# Patient Record
Sex: Female | Born: 1970 | Race: White | Hispanic: No | Marital: Single | Smoking: Current every day smoker
Health system: Southern US, Community
[De-identification: ages and names within clinical notes are randomized; demographics above are authoritative.]

## PROBLEM LIST (undated history)

## (undated) DIAGNOSIS — I639 Cerebral infarction, unspecified: Secondary | ICD-10-CM

## (undated) DIAGNOSIS — F329 Major depressive disorder, single episode, unspecified: Secondary | ICD-10-CM

## (undated) DIAGNOSIS — I671 Cerebral aneurysm, nonruptured: Secondary | ICD-10-CM

## (undated) DIAGNOSIS — G839 Paralytic syndrome, unspecified: Secondary | ICD-10-CM

## (undated) DIAGNOSIS — F32A Depression, unspecified: Secondary | ICD-10-CM

## (undated) HISTORY — PX: CEREBRAL ANEURYSM REPAIR: SHX164

---
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
## 2013-11-30 DIAGNOSIS — I6930 Unspecified sequelae of cerebral infarction: Secondary | ICD-10-CM | POA: Insufficient documentation

## 2013-12-01 DIAGNOSIS — I671 Cerebral aneurysm, nonruptured: Secondary | ICD-10-CM | POA: Insufficient documentation

## 2013-12-04 DIAGNOSIS — I69891 Dysphagia following other cerebrovascular disease: Secondary | ICD-10-CM | POA: Insufficient documentation

## 2014-01-28 DIAGNOSIS — K219 Gastro-esophageal reflux disease without esophagitis: Secondary | ICD-10-CM | POA: Insufficient documentation

## 2014-04-09 DIAGNOSIS — F17200 Nicotine dependence, unspecified, uncomplicated: Secondary | ICD-10-CM | POA: Insufficient documentation

## 2014-04-09 DIAGNOSIS — F1410 Cocaine abuse, uncomplicated: Secondary | ICD-10-CM | POA: Insufficient documentation

## 2019-05-03 ENCOUNTER — Encounter (HOSPITAL_COMMUNITY): Payer: Self-pay | Admitting: *Deleted

## 2019-05-03 ENCOUNTER — Other Ambulatory Visit: Payer: Self-pay

## 2019-05-03 ENCOUNTER — Emergency Department (HOSPITAL_COMMUNITY)
Admission: EM | Admit: 2019-05-03 | Discharge: 2019-05-03 | Disposition: A | Discharge status: Home / Self Care | Payer: Medicare Other | Attending: Emergency Medicine | Admitting: Emergency Medicine

## 2019-05-03 ENCOUNTER — Emergency Department (HOSPITAL_COMMUNITY): Payer: Medicare Other

## 2019-05-03 DIAGNOSIS — F17200 Nicotine dependence, unspecified, uncomplicated: Secondary | ICD-10-CM | POA: Diagnosis not present

## 2019-05-03 DIAGNOSIS — R51 Headache: Secondary | ICD-10-CM | POA: Insufficient documentation

## 2019-05-03 DIAGNOSIS — R519 Headache, unspecified: Secondary | ICD-10-CM

## 2019-05-03 DIAGNOSIS — L03031 Cellulitis of right toe: Secondary | ICD-10-CM

## 2019-05-03 HISTORY — DX: Cerebral aneurysm, nonruptured: I67.1

## 2019-05-03 HISTORY — DX: Cerebral infarction, unspecified: I63.9

## 2019-05-03 HISTORY — DX: Paralytic syndrome, unspecified: G83.9

## 2019-05-03 HISTORY — DX: Depression, unspecified: F32.A

## 2019-05-03 LAB — CBC WITH DIFFERENTIAL/PLATELET
Abs Immature Granulocytes: 0.06 10*3/uL (ref 0.00–0.07)
Basophils Absolute: 0 10*3/uL (ref 0.0–0.1)
Basophils Relative: 0 %
Eosinophils Absolute: 0.1 10*3/uL (ref 0.0–0.5)
Eosinophils Relative: 1 %
HCT: 49.8 % — ABNORMAL HIGH (ref 36.0–46.0)
Hemoglobin: 16.1 g/dL — ABNORMAL HIGH (ref 12.0–15.0)
Immature Granulocytes: 1 %
Lymphocytes Relative: 36 %
Lymphs Abs: 3.7 10*3/uL (ref 0.7–4.0)
MCH: 29.7 pg (ref 26.0–34.0)
MCHC: 32.3 g/dL (ref 30.0–36.0)
MCV: 91.7 fL (ref 80.0–100.0)
Monocytes Absolute: 0.7 10*3/uL (ref 0.1–1.0)
Monocytes Relative: 7 %
Neutro Abs: 5.6 10*3/uL (ref 1.7–7.7)
Neutrophils Relative %: 55 %
Platelets: 354 10*3/uL (ref 150–400)
RBC: 5.43 MIL/uL — ABNORMAL HIGH (ref 3.87–5.11)
RDW: 13 % (ref 11.5–15.5)
WBC: 10.1 10*3/uL (ref 4.0–10.5)
nRBC: 0 % (ref 0.0–0.2)

## 2019-05-03 LAB — I-STAT CHEM 8, ED
BUN: 14 mg/dL (ref 6–20)
Calcium, Ion: 1.21 mmol/L (ref 1.15–1.40)
Chloride: 104 mmol/L (ref 98–111)
Creatinine, Ser: 1 mg/dL (ref 0.44–1.00)
Glucose, Bld: 97 mg/dL (ref 70–99)
HCT: 50 % — ABNORMAL HIGH (ref 36.0–46.0)
Hemoglobin: 17 g/dL — ABNORMAL HIGH (ref 12.0–15.0)
Potassium: 3.8 mmol/L (ref 3.5–5.1)
Sodium: 143 mmol/L (ref 135–145)
TCO2: 26 mmol/L (ref 22–32)

## 2019-05-03 LAB — I-STAT CREATININE, ED: Creatinine, Ser: 1 mg/dL (ref 0.44–1.00)

## 2019-05-03 MED ORDER — PROCHLORPERAZINE EDISYLATE 10 MG/2ML IJ SOLN
10.0000 mg | Freq: Once | INTRAMUSCULAR | Status: AC
  Administered 2019-05-03: 11:00:00 10 mg via INTRAVENOUS
  Filled 2019-05-03: qty 2

## 2019-05-03 MED ORDER — SODIUM CHLORIDE 0.9 % IV BOLUS
1000.0000 mL | Freq: Once | INTRAVENOUS | Status: AC
  Administered 2019-05-03: 1000 mL via INTRAVENOUS

## 2019-05-03 MED ORDER — IOHEXOL 350 MG/ML SOLN
75.0000 mL | Freq: Once | INTRAVENOUS | Status: AC | PRN
  Administered 2019-05-03: 75 mL via INTRAVENOUS

## 2019-05-03 MED ORDER — ACETAMINOPHEN 500 MG PO TABS
1000.0000 mg | ORAL_TABLET | Freq: Once | ORAL | Status: AC
  Administered 2019-05-03: 1000 mg via ORAL
  Filled 2019-05-03: qty 2

## 2019-05-03 MED ORDER — DIPHENHYDRAMINE HCL 50 MG/ML IJ SOLN
25.0000 mg | Freq: Once | INTRAMUSCULAR | Status: AC
  Administered 2019-05-03: 11:00:00 25 mg via INTRAVENOUS
  Filled 2019-05-03: qty 1

## 2019-05-03 MED ORDER — CEPHALEXIN 500 MG PO CAPS: 500.0000 mg | ORAL_CAPSULE | Freq: Two times a day (BID) | ORAL | 0 refills | Status: AC

## 2019-05-03 NOTE — ED Triage Notes (Signed)
Pt c/o headache for the past 2 days. 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 Pt most concerned with pain in her toes in the R foot.

## 2019-05-03 NOTE — ED Provider Notes (Signed)
[facility] EMERGENCY DEPARTMENT Provider Note   CSN: [id number] Arrival date & time: 05/03/19  0023    History   Chief Complaint Chief Complaint  Patient presents with   Headache    HPI Abigail Reese is a 48 y.o. female.     HPI  Thursday evening began to have headache, boyfriend brought 2 tylenol and slept all day Friday, then Friday evening decided to come in. Has been waiting in waiting room.  Headache started slowly at [place] and then worsened.  By last night was 10/10, severe pain.   Hurts left temple. Improved with tylenol then worsened again now.  No nausea or vomiting. Not worse with bright lights or loud sounds.  No new numbness/weakness other than 2 toes of right foot (normally has weakness and numbness, now having some pain/tingling to 4th and 5th toes of right foot.   No fevers, chills.     Past Medical History:  Diagnosis Date   Brain aneurysm    Depression    Paralysis ([place])    R sided deficits    Stroke ([place])     There are no active problems to display for this patient.   Past Surgical History:  Procedure Laterality Date   CEREBRAL ANEURYSM REPAIR     CESAREAN SECTION       OB History   No obstetric history on file.      Home Medications    Prior to Admission medications   Not on File    Family History No family history on file.  Social History Social History   Tobacco Use   Smoking status: Current Every Day Smoker  Substance Use Topics   Alcohol use: Yes    Comment: rarely   Drug use: Not on file     Allergies   Patient has no known allergies.   Review of Systems Review of Systems  Constitutional: Negative for fever.  HENT: Negative for sore throat.   Eyes: Negative for visual disturbance.  Respiratory: Negative for cough and shortness of breath.   Cardiovascular: Negative for chest pain.  Gastrointestinal: Negative for abdominal pain, nausea and vomiting.  Genitourinary: Negative for  difficulty urinating.  Musculoskeletal: Positive for arthralgias. Negative for back pain and neck pain.  Skin: Negative for rash.  Neurological: Positive for headaches. Negative for syncope, facial asymmetry, weakness and numbness.     Physical Exam Updated Vital Signs BP 110/79 (BP Location: Left Arm)    Pulse 77    Temp 98.1 F (36.7 C) (Oral)    Resp 16    SpO2 100%   Physical Exam Vitals signs and nursing note reviewed.  Constitutional:      General: She is not in acute distress.    Appearance: She is well-developed. She is not diaphoretic.  HENT:     Head: Normocephalic and atraumatic.  Eyes:     Conjunctiva/sclera: Conjunctivae normal.  Neck:     Musculoskeletal: Normal range of motion.  Cardiovascular:     Rate and Rhythm: Normal rate and regular rhythm.  Pulmonary:     Effort: Pulmonary effort is normal. No respiratory distress.  Musculoskeletal:     Right foot: Tenderness (tenderness and erythema to right small toe) present.  Skin:    General: Skin is warm and dry.     Findings: No erythema or rash.  Neurological:     Mental Status: She is alert and oriented to person, place, and time.      ED Treatments /  Results  Labs (all labs ordered are listed, but only abnormal results are displayed) Labs Reviewed  CBC WITH DIFFERENTIAL/PLATELET - Abnormal; Notable for the following components:      Result Value   RBC 5.43 (*)    Hemoglobin 16.1 (*)    HCT 49.8 (*)    All other components within normal limits  I-STAT CHEM 8, ED - Abnormal; Notable for the following components:   Hemoglobin 17.0 (*)    HCT 50.0 (*)    All other components within normal limits  I-STAT CREATININE, ED    EKG None  Radiology Ct Angio Head W Or Wo Contrast  Result Date: 05/03/2019 CLINICAL DATA:  Acute left-sided headache. History of stented aneurysm. EXAM: CT ANGIOGRAPHY HEAD TECHNIQUE: Multidetector CT imaging of the head was performed using the standard protocol during bolus  administration of intravenous contrast. Multiplanar CT image reconstructions and MIPs were obtained to evaluate the vascular anatomy. CONTRAST:  75mL OMNIPAQUE IOHEXOL 350 MG/ML SOLN COMPARISON:  None. FINDINGS: CT HEAD Brain: There is a large amount of encephalomalacia within the posterior left hemisphere, within the posterior portion of the left MCA territory. There is no acute intracranial hemorrhage. There is ex vacuo dilatation of the lateral ventricles. Vascular: Coil mass near the left ICA terminus. Skull: The visualized skull base, calvarium and extracranial soft tissues are normal. Sinuses/Orbits: No fluid levels or advanced mucosal thickening of the visualized paranasal sinuses. No mastoid or middle ear effusion. The orbits are normal. CTA HEAD POSTERIOR CIRCULATION: --Vertebral arteries: Normal V4 segments. --Posterior inferior cerebellar arteries (PICA): Patent origins from the vertebral arteries. --Anterior inferior cerebellar arteries (AICA): Patent origins from the basilar artery. --Basilar artery: Normal. --Superior cerebellar arteries: Normal. --Posterior cerebral arteries (PCA): Normal. Both originate from the basilar artery. Posterior communicating arteries (p-comm) are diminutive or absent. ANTERIOR CIRCULATION: --Intracranial internal carotid arteries: Coil mass near the left ICA terminus. No residual aneurysm filling is visible, though the surrounding area is largely obscured by streak artifact. --Anterior cerebral arteries (ACA): Normal. Both A1 segments are present. Patent anterior communicating artery (a-comm). --Middle cerebral arteries (MCA): Normal. IMPRESSION: 1. No acute intracranial abnormality. 2. 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 Electronically Signed   By: Deatra RobinsonKevin  Herman M.D.   On: 05/03/2019 02:50    Procedures Procedures (including critical care time)  Medications Ordered in ED Medications  iohexol  (OMNIPAQUE) 350 MG/ML injection 75 mL (75 mLs Intravenous Contrast Given 05/03/19 0148)  prochlorperazine (COMPAZINE) injection 10 mg (10 mg Intravenous Given 05/03/19 1040)  diphenhydrAMINE (BENADRYL) injection 25 mg (25 mg Intravenous Given 05/03/19 1037)  sodium chloride 0.9 % bolus 1,000 mL (1,000 mLs Intravenous Bolus from Bag 05/03/19 1045)  acetaminophen (TYLENOL) tablet 1,000 mg (1,000 mg Oral Given 05/03/19 1036)     Initial Impression / Assessment and Plan / ED Course  I have reviewed the triage vital signs and the nursing notes.  Pertinent labs & imaging results that were available during my care of the patient were reviewed by me and considered in my medical decision making (see chart for details).        48 year old female with history of aneurysm, CVA who presents with headaches and toe pain.  CT angios head and neck performed showing no acute intracranial abnormality, post coiling of left ICA aneurysm with posterior left MCA territory encephalomalacia.  Given history provided a negative CTA, have low suspicion for subarachnoid hemorrhage.  Given headache cocktail  for headache.  Labs otherwise do not show any significant abnormalities.  Headache with improvement in ED.  She does report pain to her fourth and fifth right toes.  She has palpable pulses bilaterally, location for acute arterial occlusion.  She has erythema just of the small toe.  I do not feel this is in a pattern of embolic phenomena or COVID toes.  No history of trauma and have low suspicion for fracture.  Suspect a localized cellulitis and will prescribe Keflex.  Final Clinical Impressions(s) / ED Diagnoses   Final diagnoses:  Cellulitis of toe of right foot  Acute nonintractable headache, unspecified headache type    ED Discharge Orders    None       Alvira MondaySchlossman, Roseland Braun, MD 05/04/19 [phone]

## 2019-12-30 IMAGING — CT CT ANGIOGRAPHY HEAD
2 of 11 series · 8 of 47 positions shown · IV contrast (Omni 300)
Comparison: None.

CLINICAL DATA: Acute left-sided headache. History of stented
aneurysm.

EXAM:
CT ANGIOGRAPHY HEAD
TECHNIQUE: Multidetector CT imaging of the head was performed using the
standard protocol during bolus administration of intravenous
contrast. Multiplanar CT image reconstructions and MIPs were
obtained to evaluate the vascular anatomy.
CONTRAST:  75mL OMNIPAQUE IOHEXOL 350 MG/ML SOLN

[Series 6: head bone · axial · 0.45mm/px · z∈[-95,+13]mm · 4 of 90 slices shown]
[im 18/90  bone]
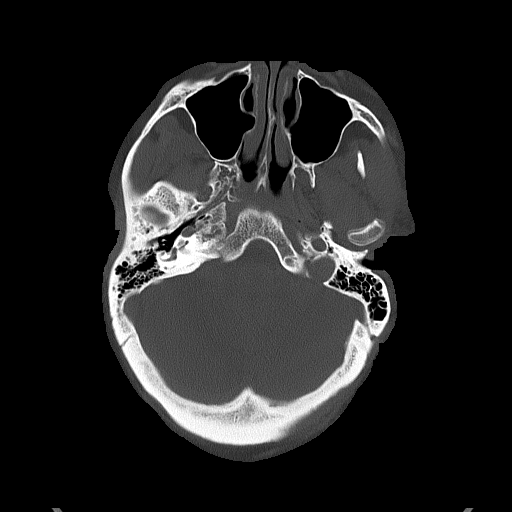
[im 36/90  bone]
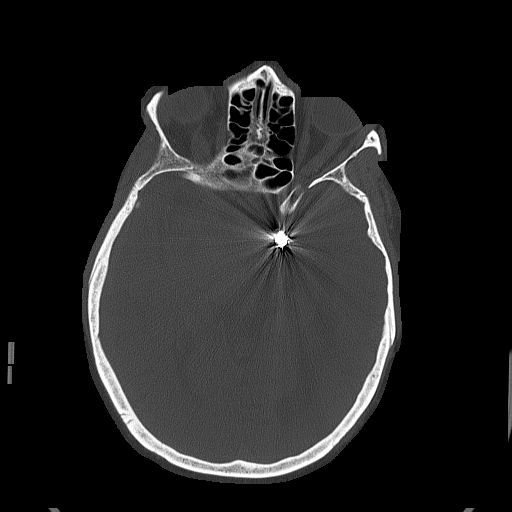
[im 54/90  bone]
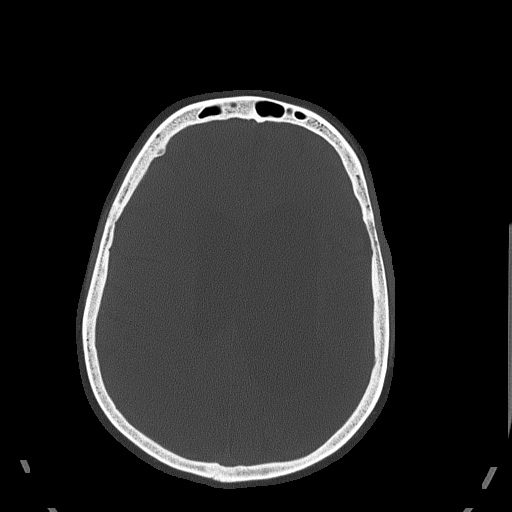
[im 72/90  bone]
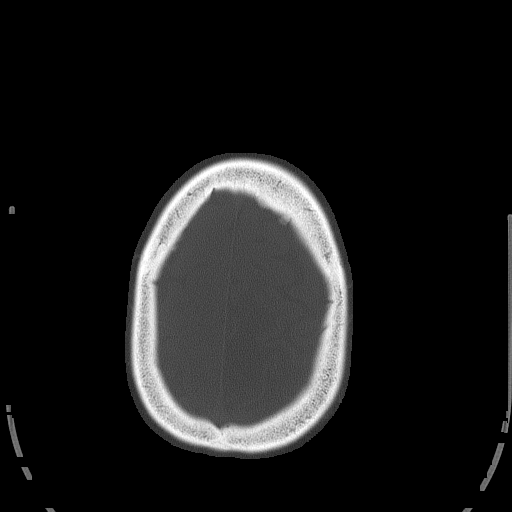

[Series 9: cow 2.0 · axial · 0.44mm/px · z∈[-134,-28]mm · 4 of 89 slices shown]
[im 18/89  brain]
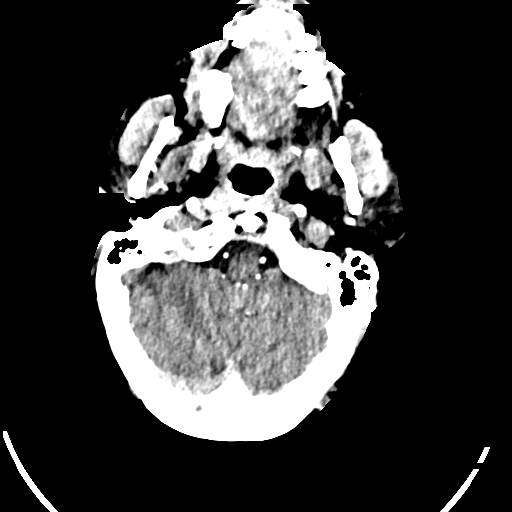
[im 36/89  bone]
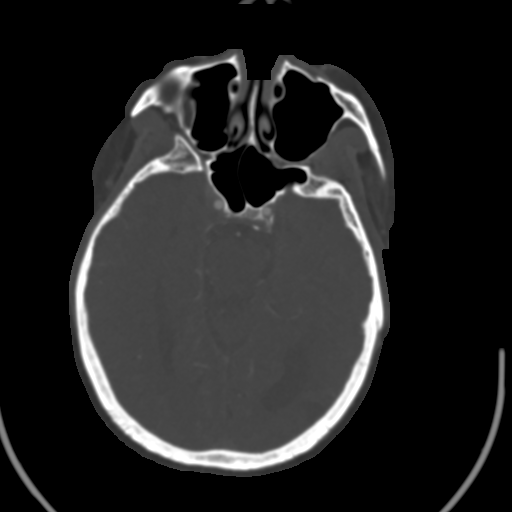
[im 53/89  brain]
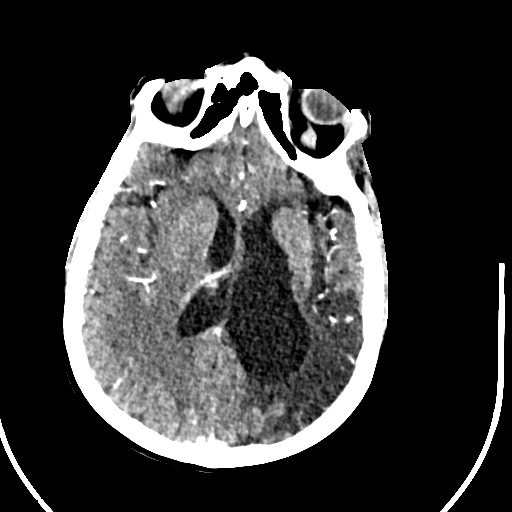
[im 71/89  bone]
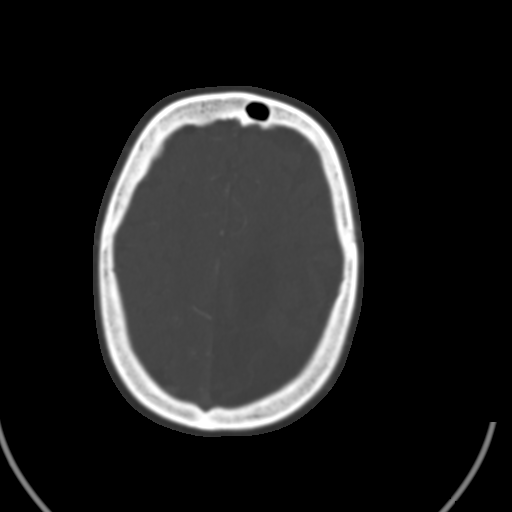

[8 of 47 positions shown; findings below may reference images not displayed]

FINDINGS: CT HEAD

Brain: There is a large amount of encephalomalacia within the
posterior left hemisphere, within the posterior portion of the left
MCA territory. There is no acute intracranial hemorrhage. There is
ex vacuo dilatation of the lateral ventricles.

Vascular: Coil mass near the left ICA terminus.

Skull: The visualized skull base, calvarium and extracranial soft
tissues are normal.

Sinuses/Orbits: No fluid levels or advanced mucosal thickening of
the visualized paranasal sinuses. No mastoid or middle ear effusion.
The orbits are normal.

CTA HEAD

POSTERIOR CIRCULATION:

--Vertebral arteries: Normal V4 segments.

--Posterior inferior cerebellar arteries (PICA): Patent origins from
the vertebral arteries.

--Anterior inferior cerebellar arteries (AICA): Patent origins from
the basilar artery.

--Basilar artery: Normal.

--Superior cerebellar arteries: Normal.

--Posterior cerebral arteries (PCA): Normal. Both originate from the
basilar artery. Posterior communicating arteries (p-comm) are
diminutive or absent.

ANTERIOR CIRCULATION:

--Intracranial internal carotid arteries: Coil mass near the left
ICA terminus. No residual aneurysm filling is visible, though the
surrounding area is largely obscured by streak artifact.

--Anterior cerebral arteries (ACA): Normal. Both A1 segments are
present. Patent anterior communicating artery (a-comm).

--Middle cerebral arteries (MCA): Normal.
IMPRESSION: 1. No acute intracranial abnormality.
2. Status post coiling of left ICA aneurysm with large area of
posterior left MCA territory encephalomalacia.

## 2022-03-02 ENCOUNTER — Other Ambulatory Visit: Payer: Self-pay | Admitting: Student

## 2022-03-02 DIAGNOSIS — Z1231 Encounter for screening mammogram for malignant neoplasm of breast: Secondary | ICD-10-CM

## 2022-03-10 ENCOUNTER — Ambulatory Visit: Payer: Medicare Other

## 2023-02-08 ENCOUNTER — Emergency Department (HOSPITAL_COMMUNITY): Payer: 59

## 2023-02-08 ENCOUNTER — Other Ambulatory Visit: Payer: Self-pay

## 2023-02-08 ENCOUNTER — Inpatient Hospital Stay (HOSPITAL_COMMUNITY)
Admission: EM | Admit: 2023-02-08 | Discharge: 2023-02-10 | DRG: 418 | Disposition: A | Discharge status: Home / Self Care | Payer: 59 | Attending: Surgery | Admitting: Surgery

## 2023-02-08 DIAGNOSIS — I671 Cerebral aneurysm, nonruptured: Secondary | ICD-10-CM | POA: Diagnosis present

## 2023-02-08 DIAGNOSIS — I69351 Hemiplegia and hemiparesis following cerebral infarction affecting right dominant side: Secondary | ICD-10-CM

## 2023-02-08 DIAGNOSIS — N951 Menopausal and female climacteric states: Secondary | ICD-10-CM | POA: Diagnosis present

## 2023-02-08 DIAGNOSIS — F17200 Nicotine dependence, unspecified, uncomplicated: Secondary | ICD-10-CM | POA: Diagnosis present

## 2023-02-08 DIAGNOSIS — K5900 Constipation, unspecified: Secondary | ICD-10-CM | POA: Diagnosis present

## 2023-02-08 DIAGNOSIS — K8000 Calculus of gallbladder with acute cholecystitis without obstruction: Principal | ICD-10-CM | POA: Diagnosis present

## 2023-02-08 DIAGNOSIS — K810 Acute cholecystitis: Secondary | ICD-10-CM | POA: Diagnosis present

## 2023-02-08 DIAGNOSIS — Z79899 Other long term (current) drug therapy: Secondary | ICD-10-CM

## 2023-02-08 DIAGNOSIS — F32A Depression, unspecified: Secondary | ICD-10-CM | POA: Diagnosis present

## 2023-02-08 DIAGNOSIS — K828 Other specified diseases of gallbladder: Secondary | ICD-10-CM | POA: Diagnosis present

## 2023-02-08 LAB — URINALYSIS, ROUTINE W REFLEX MICROSCOPIC
Bilirubin Urine: NEGATIVE
Glucose, UA: NEGATIVE mg/dL
Hgb urine dipstick: NEGATIVE
Ketones, ur: NEGATIVE mg/dL
Leukocytes,Ua: NEGATIVE
Nitrite: NEGATIVE
Protein, ur: NEGATIVE mg/dL
Specific Gravity, Urine: >1.046 — ABNORMAL HIGH (ref 1.005–1.030)
pH: 6 (ref 5.0–8.0)

## 2023-02-08 LAB — CBC WITH DIFFERENTIAL/PLATELET
Abs Immature Granulocytes: 0.05 10*3/uL (ref 0.00–0.07)
Basophils Absolute: 0 10*3/uL (ref 0.0–0.1)
Basophils Relative: 0 %
Eosinophils Absolute: 0.2 10*3/uL (ref 0.0–0.5)
Eosinophils Relative: 2 %
HCT: 44.8 % (ref 36.0–46.0)
Hemoglobin: 15.2 g/dL — ABNORMAL HIGH (ref 12.0–15.0)
Immature Granulocytes: 0 %
Lymphocytes Relative: 25 %
Lymphs Abs: 2.8 10*3/uL (ref 0.7–4.0)
MCH: 30 pg (ref 26.0–34.0)
MCHC: 33.9 g/dL (ref 30.0–36.0)
MCV: 88.5 fL (ref 80.0–100.0)
Monocytes Absolute: 0.8 10*3/uL (ref 0.1–1.0)
Monocytes Relative: 7 %
Neutro Abs: 7.4 10*3/uL (ref 1.7–7.7)
Neutrophils Relative %: 66 %
Platelets: 406 10*3/uL — ABNORMAL HIGH (ref 150–400)
RBC: 5.06 MIL/uL (ref 3.87–5.11)
RDW: 12.3 % (ref 11.5–15.5)
WBC: 11.2 10*3/uL — ABNORMAL HIGH (ref 4.0–10.5)
nRBC: 0 % (ref 0.0–0.2)

## 2023-02-08 LAB — COMPREHENSIVE METABOLIC PANEL
ALT: 21 U/L (ref 0–44)
AST: 25 U/L (ref 15–41)
Albumin: 3.5 g/dL (ref 3.5–5.0)
Alkaline Phosphatase: 93 U/L (ref 38–126)
Anion gap: 9 (ref 5–15)
BUN: 9 mg/dL (ref 6–20)
CO2: 25 mmol/L (ref 22–32)
Calcium: 9.1 mg/dL (ref 8.9–10.3)
Chloride: 100 mmol/L (ref 98–111)
Creatinine, Ser: 0.97 mg/dL (ref 0.44–1.00)
GFR, Estimated: >60 mL/min (ref >60)
Glucose, Bld: 163 mg/dL — ABNORMAL HIGH (ref 70–99)
Potassium: 2.9 mmol/L — ABNORMAL LOW (ref 3.5–5.1)
Sodium: 134 mmol/L — ABNORMAL LOW (ref 135–145)
Total Bilirubin: 0.8 mg/dL (ref 0.3–1.2)
Total Protein: 7.6 g/dL (ref 6.5–8.1)

## 2023-02-08 LAB — LIPASE, BLOOD: Lipase: 39 U/L (ref 11–51)

## 2023-02-08 MED ORDER — FENTANYL CITRATE PF 50 MCG/ML IJ SOSY: 50.0000 ug | PREFILLED_SYRINGE | Freq: Once | INTRAMUSCULAR | Status: DC

## 2023-02-08 MED ORDER — SODIUM CHLORIDE 0.9 % IV BOLUS
1000.0000 mL | Freq: Once | INTRAVENOUS | Status: AC
  Administered 2023-02-08: 1000 mL via INTRAVENOUS

## 2023-02-08 MED ORDER — ONDANSETRON HCL 4 MG/2ML IJ SOLN
4.0000 mg | Freq: Once | INTRAMUSCULAR | Status: AC
  Administered 2023-02-08: 4 mg via INTRAVENOUS
  Filled 2023-02-08: qty 2

## 2023-02-08 MED ORDER — IOHEXOL 300 MG/ML  SOLN
100.0000 mL | Freq: Once | INTRAMUSCULAR | Status: AC | PRN
  Administered 2023-02-08: 100 mL via INTRAVENOUS

## 2023-02-08 NOTE — ED Triage Notes (Signed)
Pt BIB EMS for abdominal pain and constipation. Pt has tried increasing fiber and fluids with no BM x 1 weeks. Some right sided abdominal pain as well.

## 2023-02-08 NOTE — ED Provider Notes (Signed)
[facility] EMERGENCY DEPARTMENT AT [facility] Provider Note   CSN: [id number] Arrival date & time: 02/08/23  2024     History  No chief complaint on file.   Abigail Reese is a 52 y.o. female.  The history is provided by the patient and medical records. No language interpreter was used.     52 year old for female significant history of prior stroke causing paralysis brought here via EMS for evaluation of abdominal pain and constipation.  Patient states she has had constipation ongoing for the past week.  Normally have bowel movement 2-3 times a week but have not had any since.  She is able to pass flatus.  She did endorse bouts of nausea and vomiting.  She denies any fever or chills no chest pain or shortness of breath.  She does have abdominal cramping.  Patient states she is currently on Ozempic and has been so for the past year.  States she lost approximately 60 pounds.  She did have constipation when she first started the medication but has been doing well since.  She denies any prior abdominal surgeries.  Her deficits from her prior stroke including right-sided hemiparesis for which she is currently undergoing physical therapy and use a cane to walk.  Home Medications Prior to Admission medications   Not on File      Allergies    Patient has no known allergies.    Review of Systems   Review of Systems  All other systems reviewed and are negative.   Physical Exam Updated Vital Signs BP 108/89 (BP Location: Left Arm)   Pulse (!) 101   Temp 98.4 F (36.9 C) (Oral)   Resp 18   SpO2 97%  Physical Exam Vitals and nursing note reviewed.  Constitutional:      General: She is not in acute distress.    Appearance: She is well-developed.  HENT:     Head: Atraumatic.  Eyes:     Conjunctiva/sclera: Conjunctivae normal.  Cardiovascular:     Rate and Rhythm: Normal rate and regular rhythm.     Pulses: Normal pulses.     Heart sounds: Normal heart sounds.  Pulmonary:      Effort: Pulmonary effort is normal.  Abdominal:     Palpations: Abdomen is soft.     Tenderness: There is abdominal tenderness (Mild diffuse tenderness without guarding or rebound tenderness.  No focal point tenderness.).  Genitourinary:    Comments: Chaperone present on exam.  Normal rectal tone no obvious mass no impacted stool normal color stool on glove no bleeding. Musculoskeletal:     Cervical back: Neck supple.  Skin:    Findings: No rash.  Neurological:     Mental Status: She is alert.  Psychiatric:        Mood and Affect: Mood normal.     ED Results / Procedures / Treatments   Labs (all labs ordered are listed, but only abnormal results are displayed) Labs Reviewed  CBC WITH DIFFERENTIAL/PLATELET - Abnormal; Notable for the following components:      Result Value   WBC 11.2 (*)    Hemoglobin 15.2 (*)    Platelets 406 (*)    All other components within normal limits  COMPREHENSIVE METABOLIC PANEL - Abnormal; Notable for the following components:   Sodium 134 (*)    Potassium 2.9 (*)    Glucose, Bld 163 (*)    All other components within normal limits  LIPASE, BLOOD  URINALYSIS, ROUTINE W REFLEX  MICROSCOPIC    EKG None  Radiology CT ABDOMEN PELVIS W CONTRAST  Result Date: 02/08/2023 CLINICAL DATA:  Abdominal pain constipation. EXAM: CT ABDOMEN AND PELVIS WITH CONTRAST TECHNIQUE: Multidetector CT imaging of the abdomen and pelvis was performed using the standard protocol following bolus administration of intravenous contrast. RADIATION DOSE REDUCTION: This exam was performed according to the departmental dose-optimization program which includes automated exposure control, adjustment of the mA and/or kV according to patient size and/or use of iterative reconstruction technique. CONTRAST:  OMNIPAQUE IOHEXOL 300 MG/ML  SOLN COMPARISON:  None Available. FINDINGS: Lower chest: No acute abnormality. Hepatobiliary: Fatty infiltration of the liver is noted. No  biliary ductal dilatation. A stone is present within the gallbladder and there is gallbladder wall thickening with surrounding fat stranding. Pancreas: Unremarkable. No pancreatic ductal dilatation or surrounding inflammatory changes. Spleen: Normal in size without focal abnormality. Adrenals/Urinary Tract: The adrenal glands are within normal limits. The kidneys enhance symmetrically. Nonobstructive left renal calculus is noted. No obstructive uropathy bilaterally. The bladder is unremarkable. Stomach/Bowel: There is a moderate hiatal hernia. Stomach is within normal limits. Appendix appears normal. No evidence of bowel wall thickening, distention, or inflammatory changes. No free air or pneumatosis. A few scattered diverticula are present along the colon without evidence of diverticulitis. A mild to moderate amount of retained stool is present in the colon. Vascular/Lymphatic: Aortic atherosclerosis. No enlarged abdominal or pelvic lymph nodes. Reproductive: Uterus and bilateral adnexa are unremarkable. Other: No abdominopelvic ascites. Musculoskeletal: Degenerative changes are present in the thoracolumbar spine. Mild compression deformity is noted in the superior endplate at L3, indeterminate in age. IMPRESSION: 1. Cholelithiasis with gallbladder wall thickening and surrounding fat stranding, concerning for acute cholecystitis. Ultrasound is recommended for further evaluation. 2. Hepatic steatosis. 3. Mild-to-moderate amount of retained stool in the colon. 4. Moderate hiatal hernia. 5. Diverticulosis without diverticulitis. 6. Aortic atherosclerosis. 7. Compression deformity in the superior endplate at L3, indeterminate in age. Electronically Signed   By: Thornell Sartorius M.D.   On: 02/08/2023 22:16   DG Abdomen 1 View  Result Date: 02/08/2023 CLINICAL DATA:  Constipation EXAM: ABDOMEN - 1 VIEW COMPARISON:  None Available. FINDINGS: The bowel gas pattern is normal. No radio-opaque calculi or other significant  radiographic abnormality are seen. Mild stool in the colon. IMPRESSION: Negative.  Mild stool burden Electronically Signed   By: Jasmine Pang M.D.   On: 02/08/2023 21:07    Procedures Procedures    Medications Ordered in ED Medications  sodium chloride 0.9 % bolus 1,000 mL (has no administration in time range)  ondansetron (ZOFRAN) injection 4 mg (has no administration in time range)  iohexol (OMNIPAQUE) 300 MG/ML solution 100 mL (100 mLs Intravenous Contrast Given 02/08/23 2155)    ED Course/ Medical Decision Making/ A&P                             Medical Decision Making Amount and/or Complexity of Data Reviewed Labs: ordered. Radiology: ordered.  Risk Prescription drug management.   BP 108/85 (BP Location: Left Arm)   Pulse (!) 128   Temp 98.4 F (36.9 C) (Oral)   Resp 18   SpO2 97%   61:41 PM  52 year old for female significant history of prior stroke causing paralysis brought here via EMS for evaluation of abdominal pain and constipation.  Patient states she has had constipation ongoing for the past week.  Normally have bowel movement 2-3 times a week but  have not had any since.  She is able to pass flatus.  She did endorse bouts of nausea and vomiting.  She denies any fever or chills no chest pain or shortness of breath.  She does have abdominal cramping.  Patient states she is currently on Ozempic and has been so for the past year.  States she lost approximately 60 pounds.  She did have constipation when she first started the medication but has been doing well since.  She denies any prior abdominal surgeries.  Her deficits from her prior stroke including right-sided hemiparesis for which she is currently undergoing physical therapy and use a cane to walk.  On exam, patient is resting comfortably in bed appears to be in no acute discomfort.  She has notable right-sided weakness from her prior stroke.  Heart with normal rate and rhythm, lungs clear to auscultation bilaterally  abdomen is soft and diffusely tender without guarding or rebound tenderness.  Bowel sounds present.  -Labs ordered, independently viewed and interpreted by me.  Labs remarkable for WBC 11.2, K+ 2.9 will provide supplementation -The patient was maintained on a cardiac monitor.  I personally viewed and interpreted the cardiac monitored which showed an underlying rhythm of: sinus tachycardia -Imaging independently viewed and interpreted by me and I agree with radiologist's interpretation.  Result remarkable for KUB showing no significant stool burden. Paucity of gas. -This patient presents to the ED for concern of abd pain, this involves an extensive number of treatment options, and is a complaint that carries with it a high risk of complications and morbidity.  The differential diagnosis includes constipation, colitis, diverticulitis, appendicitis, pancreatitis, biliary disease -Co morbidities that complicate the patient evaluation includes stroke -Treatment includes IVF, antiemetic, opiate -Reevaluation of the patient after these medicines showed that the patient improved -PCP office notes or outside notes reviewed -Discussion with specialist General surgeon Dr. Luisa Hart who will admit pt -Escalation to admission/observation considered: patient is agreeable with admission  10:52 PM Workup today is remarkable for signs of acute cholecystitis.  Will obtain limited abdominal ultrasound to confirm.  I did reach out to on-call general surgeon Dr. Luisa Hart who agrees to admit patient and will reassess patient tomorrow and plan for surgical intervention.  Patient will be n.p.o. at midnight.        Final Clinical Impression(s) / ED Diagnoses Final diagnoses:  Acute cholecystitis    Rx / DC Orders ED Discharge Orders     None         Fayrene Helper, PA-C 02/08/23 2253    Melene Plan, DO 02/08/23 2328

## 2023-02-09 ENCOUNTER — Inpatient Hospital Stay (HOSPITAL_COMMUNITY): Payer: 59 | Admitting: Anesthesiology

## 2023-02-09 ENCOUNTER — Encounter (HOSPITAL_COMMUNITY): Admission: EM | Disposition: A | Discharge status: Home / Self Care | Payer: Self-pay | Source: Home / Self Care

## 2023-02-09 ENCOUNTER — Encounter (HOSPITAL_COMMUNITY): Payer: Self-pay

## 2023-02-09 DIAGNOSIS — K810 Acute cholecystitis: Principal | ICD-10-CM | POA: Diagnosis present

## 2023-02-09 DIAGNOSIS — F17210 Nicotine dependence, cigarettes, uncomplicated: Secondary | ICD-10-CM

## 2023-02-09 DIAGNOSIS — K8000 Calculus of gallbladder with acute cholecystitis without obstruction: Secondary | ICD-10-CM

## 2023-02-09 DIAGNOSIS — K828 Other specified diseases of gallbladder: Secondary | ICD-10-CM | POA: Diagnosis present

## 2023-02-09 DIAGNOSIS — F32A Depression, unspecified: Secondary | ICD-10-CM | POA: Diagnosis present

## 2023-02-09 DIAGNOSIS — I671 Cerebral aneurysm, nonruptured: Secondary | ICD-10-CM | POA: Diagnosis present

## 2023-02-09 DIAGNOSIS — I69351 Hemiplegia and hemiparesis following cerebral infarction affecting right dominant side: Secondary | ICD-10-CM | POA: Diagnosis not present

## 2023-02-09 DIAGNOSIS — N951 Menopausal and female climacteric states: Secondary | ICD-10-CM | POA: Diagnosis present

## 2023-02-09 DIAGNOSIS — F17200 Nicotine dependence, unspecified, uncomplicated: Secondary | ICD-10-CM | POA: Diagnosis present

## 2023-02-09 DIAGNOSIS — Z79899 Other long term (current) drug therapy: Secondary | ICD-10-CM | POA: Diagnosis not present

## 2023-02-09 DIAGNOSIS — K5900 Constipation, unspecified: Secondary | ICD-10-CM | POA: Diagnosis present

## 2023-02-09 HISTORY — PX: CHOLECYSTECTOMY: SHX55

## 2023-02-09 LAB — COMPREHENSIVE METABOLIC PANEL
ALT: 18 U/L (ref 0–44)
AST: 19 U/L (ref 15–41)
Albumin: 3.1 g/dL — ABNORMAL LOW (ref 3.5–5.0)
Alkaline Phosphatase: 82 U/L (ref 38–126)
Anion gap: 9 (ref 5–15)
BUN: 9 mg/dL (ref 6–20)
CO2: 28 mmol/L (ref 22–32)
Calcium: 8.6 mg/dL — ABNORMAL LOW (ref 8.9–10.3)
Chloride: 102 mmol/L (ref 98–111)
Creatinine, Ser: 0.81 mg/dL (ref 0.44–1.00)
GFR, Estimated: >60 mL/min (ref >60)
Glucose, Bld: 101 mg/dL — ABNORMAL HIGH (ref 70–99)
Potassium: 3.4 mmol/L — ABNORMAL LOW (ref 3.5–5.1)
Sodium: 139 mmol/L (ref 135–145)
Total Bilirubin: 0.5 mg/dL (ref 0.3–1.2)
Total Protein: 6.8 g/dL (ref 6.5–8.1)

## 2023-02-09 LAB — SURGICAL PCR SCREEN
MRSA, PCR: NEGATIVE
Staphylococcus aureus: NEGATIVE

## 2023-02-09 LAB — HIV ANTIBODY (ROUTINE TESTING W REFLEX): HIV Screen 4th Generation wRfx: NONREACTIVE

## 2023-02-09 SURGERY — LAPAROSCOPIC CHOLECYSTECTOMY
Anesthesia: General

## 2023-02-09 MED ORDER — FLUOXETINE HCL 20 MG PO CAPS
60.0000 mg | ORAL_CAPSULE | Freq: Every day | ORAL | Status: DC
  Administered 2023-02-09 – 2023-02-10 (×2): 60 mg via ORAL
  Filled 2023-02-09 (×3): qty 3

## 2023-02-09 MED ORDER — FENTANYL CITRATE PF 50 MCG/ML IJ SOSY: 25.0000 ug | PREFILLED_SYRINGE | INTRAMUSCULAR | Status: DC | PRN

## 2023-02-09 MED ORDER — ACETAMINOPHEN 10 MG/ML IV SOLN
INTRAVENOUS | Status: AC
  Filled 2023-02-09: qty 100

## 2023-02-09 MED ORDER — FENTANYL CITRATE (PF) 250 MCG/5ML IJ SOLN
INTRAMUSCULAR | Status: DC | PRN
  Administered 2023-02-09 (×2): 50 ug via INTRAVENOUS
  Administered 2023-02-09: 100 ug via INTRAVENOUS

## 2023-02-09 MED ORDER — HYDROMORPHONE HCL 1 MG/ML IJ SOLN: 1.0000 mg | INTRAMUSCULAR | Status: DC | PRN

## 2023-02-09 MED ORDER — ONDANSETRON HCL 4 MG/2ML IJ SOLN
INTRAMUSCULAR | Status: DC | PRN
  Administered 2023-02-09: 4 mg via INTRAVENOUS

## 2023-02-09 MED ORDER — ATORVASTATIN CALCIUM 10 MG PO TABS: 10.0000 mg | ORAL_TABLET | Freq: Every day | ORAL | Status: DC

## 2023-02-09 MED ORDER — PHENYLEPHRINE 80 MCG/ML (10ML) SYRINGE FOR IV PUSH (FOR BLOOD PRESSURE SUPPORT)
PREFILLED_SYRINGE | INTRAVENOUS | Status: AC
  Filled 2023-02-09: qty 20

## 2023-02-09 MED ORDER — MIDAZOLAM HCL 5 MG/5ML IJ SOLN
INTRAMUSCULAR | Status: DC | PRN
  Administered 2023-02-09: 2 mg via INTRAVENOUS

## 2023-02-09 MED ORDER — LACTATED RINGERS IR SOLN
Status: DC | PRN
  Administered 2023-02-09: 1000 mL

## 2023-02-09 MED ORDER — PROPOFOL 10 MG/ML IV BOLUS
INTRAVENOUS | Status: DC | PRN
  Administered 2023-02-09: 40 mg via INTRAVENOUS
  Administered 2023-02-09: 120 mg via INTRAVENOUS

## 2023-02-09 MED ORDER — LIDOCAINE 2% (20 MG/ML) 5 ML SYRINGE
INTRAMUSCULAR | Status: DC | PRN
  Administered 2023-02-09: 40 mg via INTRAVENOUS

## 2023-02-09 MED ORDER — MUPIROCIN 2 % EX OINT: 1.0000 | TOPICAL_OINTMENT | Freq: Two times a day (BID) | CUTANEOUS | Status: DC

## 2023-02-09 MED ORDER — FENTANYL CITRATE (PF) 250 MCG/5ML IJ SOLN
INTRAMUSCULAR | Status: AC
  Filled 2023-02-09: qty 5

## 2023-02-09 MED ORDER — ROCURONIUM BROMIDE 10 MG/ML (PF) SYRINGE
PREFILLED_SYRINGE | INTRAVENOUS | Status: AC
  Filled 2023-02-09: qty 10

## 2023-02-09 MED ORDER — PHENYLEPHRINE 80 MCG/ML (10ML) SYRINGE FOR IV PUSH (FOR BLOOD PRESSURE SUPPORT)
PREFILLED_SYRINGE | INTRAVENOUS | Status: AC
  Filled 2023-02-09: qty 10

## 2023-02-09 MED ORDER — ONDANSETRON HCL 4 MG/2ML IJ SOLN: 4.0000 mg | Freq: Four times a day (QID) | INTRAMUSCULAR | Status: DC | PRN

## 2023-02-09 MED ORDER — ACETAMINOPHEN 500 MG PO TABS
1000.0000 mg | ORAL_TABLET | Freq: Four times a day (QID) | ORAL | Status: DC
  Administered 2023-02-09 – 2023-02-10 (×3): 1000 mg via ORAL
  Filled 2023-02-09 (×4): qty 2

## 2023-02-09 MED ORDER — SODIUM CHLORIDE 0.9 % IV SOLN: INTRAVENOUS | Status: DC | PRN

## 2023-02-09 MED ORDER — OXYCODONE HCL 5 MG PO TABS: 5.0000 mg | ORAL_TABLET | ORAL | Status: DC | PRN

## 2023-02-09 MED ORDER — METOPROLOL TARTRATE 5 MG/5ML IV SOLN: 5.0000 mg | Freq: Four times a day (QID) | INTRAVENOUS | Status: DC | PRN

## 2023-02-09 MED ORDER — ONDANSETRON 4 MG PO TBDP: 4.0000 mg | ORAL_TABLET | Freq: Four times a day (QID) | ORAL | Status: DC | PRN

## 2023-02-09 MED ORDER — 0.9 % SODIUM CHLORIDE (POUR BTL) OPTIME
TOPICAL | Status: DC | PRN
  Administered 2023-02-09: 1000 mL

## 2023-02-09 MED ORDER — KCL IN DEXTROSE-NACL 20-5-0.9 MEQ/L-%-% IV SOLN
INTRAVENOUS | Status: DC
  Filled 2023-02-09 (×6): qty 1000

## 2023-02-09 MED ORDER — SUGAMMADEX SODIUM 200 MG/2ML IV SOLN
INTRAVENOUS | Status: DC | PRN
  Administered 2023-02-09: 200 mg via INTRAVENOUS

## 2023-02-09 MED ORDER — PHENYLEPHRINE 80 MCG/ML (10ML) SYRINGE FOR IV PUSH (FOR BLOOD PRESSURE SUPPORT)
PREFILLED_SYRINGE | INTRAVENOUS | Status: DC | PRN
  Administered 2023-02-09: 80 ug via INTRAVENOUS
  Administered 2023-02-09 (×3): 160 ug via INTRAVENOUS

## 2023-02-09 MED ORDER — ONDANSETRON HCL 4 MG/2ML IJ SOLN
INTRAMUSCULAR | Status: AC
  Filled 2023-02-09: qty 2

## 2023-02-09 MED ORDER — ENOXAPARIN SODIUM 40 MG/0.4ML IJ SOSY
40.0000 mg | PREFILLED_SYRINGE | INTRAMUSCULAR | Status: DC
  Administered 2023-02-10: 40 mg via SUBCUTANEOUS
  Filled 2023-02-09: qty 0.4

## 2023-02-09 MED ORDER — PROMETHAZINE HCL 25 MG/ML IJ SOLN: 6.2500 mg | INTRAMUSCULAR | Status: DC | PRN

## 2023-02-09 MED ORDER — ACETAMINOPHEN 10 MG/ML IV SOLN
INTRAVENOUS | Status: DC | PRN
  Administered 2023-02-09: 1000 mg via INTRAVENOUS

## 2023-02-09 MED ORDER — SODIUM CHLORIDE 0.9 % IV SOLN
2.0000 g | INTRAVENOUS | Status: DC
  Administered 2023-02-09 – 2023-02-10 (×2): 2 g via INTRAVENOUS
  Filled 2023-02-09 (×2): qty 20

## 2023-02-09 MED ORDER — ROCURONIUM BROMIDE 50 MG/5ML IV SOSY
PREFILLED_SYRINGE | INTRAVENOUS | Status: DC | PRN
  Administered 2023-02-09: 50 mg via INTRAVENOUS

## 2023-02-09 MED ORDER — BUPIVACAINE HCL (PF) 0.5 % IJ SOLN
INTRAMUSCULAR | Status: DC | PRN
  Administered 2023-02-09: 20 mL

## 2023-02-09 MED ORDER — MIDAZOLAM HCL 2 MG/2ML IJ SOLN
INTRAMUSCULAR | Status: AC
  Filled 2023-02-09: qty 2

## 2023-02-09 MED ORDER — PROPOFOL 10 MG/ML IV BOLUS
INTRAVENOUS | Status: AC
  Filled 2023-02-09: qty 20

## 2023-02-09 MED ORDER — LACTATED RINGERS IV SOLN: INTRAVENOUS | Status: DC

## 2023-02-09 MED ORDER — ORAL CARE MOUTH RINSE: 15.0000 mL | Freq: Once | OROMUCOSAL | Status: DC

## 2023-02-09 MED ORDER — DEXAMETHASONE SODIUM PHOSPHATE 10 MG/ML IJ SOLN
INTRAMUSCULAR | Status: AC
  Filled 2023-02-09: qty 1

## 2023-02-09 MED ORDER — LIDOCAINE HCL (PF) 2 % IJ SOLN
INTRAMUSCULAR | Status: AC
  Filled 2023-02-09: qty 5

## 2023-02-09 MED ORDER — DEXAMETHASONE SODIUM PHOSPHATE 10 MG/ML IJ SOLN
INTRAMUSCULAR | Status: DC | PRN
  Administered 2023-02-09: 4 mg via INTRAVENOUS

## 2023-02-09 MED ORDER — CHLORHEXIDINE GLUCONATE 0.12 % MT SOLN: 15.0000 mL | Freq: Once | OROMUCOSAL | Status: DC

## 2023-02-09 SURGICAL SUPPLY — 31 items
APPLIER CLIP 5 13 M/L LIGAMAX5 (MISCELLANEOUS) ×1
BAG COUNTER SPONGE SURGICOUNT (BAG) IMPLANT
CABLE HIGH FREQUENCY MONO STRZ (ELECTRODE) ×1 IMPLANT
CHLORAPREP W/TINT 26 (MISCELLANEOUS) ×1 IMPLANT
CLIP APPLIE 5 13 M/L LIGAMAX5 (MISCELLANEOUS) ×1 IMPLANT
COVER MAYO STAND XLG (MISCELLANEOUS) ×1 IMPLANT
DERMABOND ADVANCED .7 DNX12 (GAUZE/BANDAGES/DRESSINGS) ×1 IMPLANT
DRAPE C-ARM 42X120 X-RAY (DRAPES) IMPLANT
ELECT REM PT RETURN 15FT ADLT (MISCELLANEOUS) ×1 IMPLANT
GLOVE BIO SURGEON STRL SZ7.5 (GLOVE) ×1 IMPLANT
GOWN STRL REUS W/ TWL XL LVL3 (GOWN DISPOSABLE) ×2 IMPLANT
GOWN STRL REUS W/TWL XL LVL3 (GOWN DISPOSABLE) ×2
HEMOSTAT SURGICEL 4X8 (HEMOSTASIS) IMPLANT
IRRIG SUCT STRYKERFLOW 2 WTIP (MISCELLANEOUS) ×1
IRRIGATION SUCT STRKRFLW 2 WTP (MISCELLANEOUS) ×1 IMPLANT
KIT BASIN OR (CUSTOM PROCEDURE TRAY) ×1 IMPLANT
KIT TURNOVER KIT A (KITS) IMPLANT
PENCIL SMOKE EVACUATOR (MISCELLANEOUS) IMPLANT
SCISSORS LAP 5X35 DISP (ENDOMECHANICALS) ×1 IMPLANT
SET CHOLANGIOGRAPH MIX (MISCELLANEOUS) IMPLANT
SET TUBE SMOKE EVAC HIGH FLOW (TUBING) ×1 IMPLANT
SLEEVE Z-THREAD 5X100MM (TROCAR) ×2 IMPLANT
SPIKE FLUID TRANSFER (MISCELLANEOUS) ×1 IMPLANT
SUT MNCRL AB 4-0 PS2 18 (SUTURE) ×1 IMPLANT
SYS BAG RETRIEVAL 10MM (BASKET) ×2
SYSTEM BAG RETRIEVAL 10MM (BASKET) ×1 IMPLANT
TOWEL OR 17X26 10 PK STRL BLUE (TOWEL DISPOSABLE) ×1 IMPLANT
TOWEL OR NON WOVEN STRL DISP B (DISPOSABLE) ×1 IMPLANT
TRAY LAPAROSCOPIC (CUSTOM PROCEDURE TRAY) ×1 IMPLANT
TROCAR BALLN 12MMX100 BLUNT (TROCAR) ×1 IMPLANT
TROCAR Z-THREAD OPTICAL 5X100M (TROCAR) ×1 IMPLANT

## 2023-02-09 NOTE — TOC CM/SW Note (Signed)
  Transition of Care (TOC) Screening Note   Patient Details  Name: Abigail Reese Date of Birth: [birth year]   Transition of Care [facility]) CM/SW Contact:    Amada Jupiter, LCSW Phone Number: 02/09/2023, 1:59 PM    Transition of Care Department [facility]) has reviewed patient and no TOC needs have been identified at this time. We will continue to monitor patient advancement through interdisciplinary progression rounds. If new patient transition needs arise, please place a TOC consult.

## 2023-02-09 NOTE — H&P (Signed)
Darreld Mclean Rockefeller 1971/05/07  [id number].    Requesting MD: Adela Lank, MD Chief Complaint/Reason for Consult: cholecystitis   HPI:  Abigail Reese is a 51 y/o F with a PMH cerebral aneurysm s/p endovascular repair at [facility] in 2009, CVA with R sided weakness,  and depression who presents with a cc abdominal pain and constipation. Reports one week ago Friday-Sunday she had RUQ pain associated with nausea and vomiting. The pain did resolve but returned 1-2 days ago. She has been eating bland foods like chicken soup all week. Associated sxs include constipation and hot flashes. At baseline she has a BM 2-3x weekly.   She denies fever, chills, chest pain, cough, emesis, melena, hematochezia, or urinary sxs.   Reports intentional weight loss of 60 lbs over the course of one year using ozempic  Social hx: lives in [place], her oldest son (84) stays with her sometimes. Unemployed. Reports occasional EtOH. Reports she quit smoking cigarettes 2 weeks ago. 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 Ambulates using a cane. Blood thinners: none  Past abdominal surgeries: c-section x 1   ROS: As above  Review of Systems  All other systems reviewed and are negative.   History reviewed. No pertinent family history.  Past Medical History:  Diagnosis Date   Brain aneurysm    Depression    Paralysis (HCC)    R sided deficits    Stroke [facility])     Past Surgical History:  Procedure Laterality Date   CEREBRAL ANEURYSM REPAIR     CESAREAN SECTION      Social History:  reports that she has been smoking. She does not have any smokeless tobacco history on file. She reports current alcohol use. She reports that she does not currently use drugs.  Allergies: No Known Allergies  Medications Prior to Admission  Medication Sig Dispense Refill   atorvastatin (LIPITOR) 10 MG tablet Take 10 mg by mouth daily.     Cholecalciferol (VITAMIN D3 PO) Take 1 capsule by mouth daily.      FLUoxetine HCl 60 MG TABS Take 60 mg by mouth in the morning.     Omega-3 Fatty Acids (FISH OIL OMEGA-3 PO) Take 1 capsule by mouth daily.     ondansetron (ZOFRAN-ODT) 4 MG disintegrating tablet Take 4 mg by mouth every 8 (eight) hours as needed for nausea or vomiting (dissolve orally).     OZEMPIC, 2 MG/DOSE, 8 MG/3ML SOPN Inject 2 mg into the skin every Wednesday.     TURMERIC PO Take 1 capsule by mouth daily.       Physical Exam: Blood pressure 132/80, pulse 79, temperature 98.6 F (37 C), resp. rate 16, height 5\' 3"  (1.6 m), weight 66.1 kg, SpO2 94 %. General: Pleasant female laying on hospital bed, appears stated age, NAD. HEENT: head -normocephalic, atraumatic; Eyes: PERRLA, no conjunctival injection, anicteric sclerae  Neck- Trachea is midline CV- RRR, normal S1/S2, no M/R/G, dorsalis pedis pulses 2+ BL, no lower extremity edema  Pulm- breathing is non-labored ORA. CTABL, no wheezes, rhales, rhonchi. Abd- soft, TTP RUQ without guarding or rebound tenderness., no masses, hernias, or organomegaly. GU- deferred  MSK- weakness RUE/RLE. Moving all extremities  Neuro- R facial droop, R sided weakness from history of CVA, otherwise no focal deficit, appropriate speech Psych- Alert and Oriented x3 with appropriate affect Skin: warm and dry, no rashes or lesions   Results for orders placed or performed during the hospital encounter  of 02/08/23 (from the past 48 hour(s))  CBC with Differential     Status: Abnormal   Collection Time: 02/08/23  9:00 PM  Result Value Ref Range   WBC 11.2 (H) 4.0 - 10.5 K/uL   RBC 5.06 3.87 - 5.11 MIL/uL   Hemoglobin 15.2 (H) 12.0 - 15.0 g/dL   HCT 16.1 09.6 - 04.5 %   MCV 88.5 80.0 - 100.0 fL   MCH 30.0 26.0 - 34.0 pg   MCHC 33.9 30.0 - 36.0 g/dL   RDW 40.9 81.1 - 91.4 %   Platelets 406 (H) 150 - 400 K/uL   nRBC 0.0 0.0 - 0.2 %   Neutrophils Relative % 66 %   Neutro Abs 7.4 1.7 - 7.7 K/uL   Lymphocytes Relative 25 %   Lymphs Abs 2.8 0.7 - 4.0 K/uL    Monocytes Relative 7 %   Monocytes Absolute 0.8 0.1 - 1.0 K/uL   Eosinophils Relative 2 %   Eosinophils Absolute 0.2 0.0 - 0.5 K/uL   Basophils Relative 0 %   Basophils Absolute 0.0 0.0 - 0.1 K/uL   Immature Granulocytes 0 %   Abs Immature Granulocytes 0.05 0.00 - 0.07 K/uL    Comment: Performed at [facility], [address] [place], [place] [address]  Comprehensive metabolic panel     Status: Abnormal   Collection Time: 02/08/23  9:00 PM  Result Value Ref Range   Sodium 134 (L) 135 - 145 mmol/L   Potassium 2.9 (L) 3.5 - 5.1 mmol/L   Chloride 100 98 - 111 mmol/L   CO2 25 22 - 32 mmol/L   Glucose, Bld 163 (H) 70 - 99 mg/dL    Comment: Glucose reference range applies only to samples taken after fasting for at least 8 hours.   BUN 9 6 - 20 mg/dL   Creatinine, Ser 6.21 0.44 - 1.00 mg/dL   Calcium 9.1 8.9 - 30.8 mg/dL   Total Protein 7.6 6.5 - 8.1 g/dL   Albumin 3.5 3.5 - 5.0 g/dL   AST 25 15 - 41 U/L   ALT 21 0 - 44 U/L   Alkaline Phosphatase 93 38 - 126 U/L   Total Bilirubin 0.8 0.3 - 1.2 mg/dL   GFR, Estimated >65 >78 mL/min    Comment: (NOTE) Calculated using the CKD-EPI Creatinine Equation (2021)    Anion gap 9 5 - 15    Comment: Performed at [facility], [address], [place], [place] [address]  Lipase, blood     Status: None   Collection Time: 02/08/23  9:00 PM  Result Value Ref Range   Lipase 39 11 - 51 U/L    Comment: Performed at [facility], [address], [place], [place] [address]  Urinalysis, Routine w reflex microscopic -Urine, Clean Catch     Status: Abnormal   Collection Time: 02/08/23 10:33 PM  Result Value Ref Range   Color, Urine YELLOW YELLOW   APPearance CLEAR CLEAR   Specific Gravity, Urine >1.046 (H) 1.005 - 1.030   pH 6.0 5.0 - 8.0   Glucose, UA NEGATIVE NEGATIVE mg/dL   Hgb urine dipstick NEGATIVE NEGATIVE   Bilirubin Urine NEGATIVE NEGATIVE   Ketones, ur NEGATIVE NEGATIVE mg/dL    Protein, ur NEGATIVE NEGATIVE mg/dL   Nitrite NEGATIVE NEGATIVE   Leukocytes,Ua NEGATIVE NEGATIVE    Comment: Performed at [facility], [address] [place], [place] [address]  Surgical PCR screen  Status: None   Collection Time: 02/09/23  2:48 AM   Specimen: Nasal Mucosa; Nasal Swab  Result Value Ref Range   MRSA, PCR NEGATIVE NEGATIVE   Staphylococcus aureus NEGATIVE NEGATIVE    Comment: (NOTE) The Xpert SA Assay (FDA approved for NASAL specimens in patients 24 years of age and older), is one component of a comprehensive surveillance program. It is not intended to diagnose infection nor to guide or monitor treatment. Performed at [facility], [address], [place], [place] [address]   Comprehensive metabolic panel     Status: Abnormal   Collection Time: 02/09/23  3:49 AM  Result Value Ref Range   Sodium 139 135 - 145 mmol/L   Potassium 3.4 (L) 3.5 - 5.1 mmol/L   Chloride 102 98 - 111 mmol/L   CO2 28 22 - 32 mmol/L   Glucose, Bld 101 (H) 70 - 99 mg/dL    Comment: Glucose reference range applies only to samples taken after fasting for at least 8 hours.   BUN 9 6 - 20 mg/dL   Creatinine, Ser 6.28 0.44 - 1.00 mg/dL   Calcium 8.6 (L) 8.9 - 10.3 mg/dL   Total Protein 6.8 6.5 - 8.1 g/dL   Albumin 3.1 (L) 3.5 - 5.0 g/dL   AST 19 15 - 41 U/L   ALT 18 0 - 44 U/L   Alkaline Phosphatase 82 38 - 126 U/L   Total Bilirubin 0.5 0.3 - 1.2 mg/dL   GFR, Estimated >31 >51 mL/min    Comment: (NOTE) Calculated using the CKD-EPI Creatinine Equation (2021)    Anion gap 9 5 - 15    Comment: Performed at [facility], [address], [place], [place] [address]   US Abdomen Limited  Result Date: 02/08/2023 CLINICAL DATA:  Abdominal pain. EXAM: ULTRASOUND ABDOMEN LIMITED RIGHT UPPER QUADRANT COMPARISON:  None Available. FINDINGS: Gallbladder: A 2.7 cm x 0.5 cm x 2.3 cm, shadowing echogenic gallstone is seen along the wall of the  dependent portion of the gallbladder lumen. The gallbladder wall measures 2.5 mm in thickness. No sonographic Murphy sign noted by sonographer. Common bile duct: Diameter: 2.2 mm Liver: No focal lesion identified. Within normal limits in parenchymal echogenicity. Portal vein is patent on color Doppler imaging with normal direction of blood flow towards the liver. Other: None. IMPRESSION: Cholelithiasis without evidence of acute cholecystitis. Electronically Signed   By: Aram Candela M.D.   On: 02/08/2023 23:52   CT ABDOMEN PELVIS W CONTRAST  Result Date: 02/08/2023 CLINICAL DATA:  Abdominal pain constipation. EXAM: CT ABDOMEN AND PELVIS WITH CONTRAST TECHNIQUE: Multidetector CT imaging of the abdomen and pelvis was performed using the standard protocol following bolus administration of intravenous contrast. RADIATION DOSE REDUCTION: This exam was performed according to the departmental dose-optimization program which includes automated exposure control, adjustment of the mA and/or kV according to patient size and/or use of iterative reconstruction technique. CONTRAST:  OMNIPAQUE IOHEXOL 300 MG/ML  SOLN COMPARISON:  None Available. FINDINGS: Lower chest: No acute abnormality. Hepatobiliary: Fatty infiltration of the liver is noted. No biliary ductal dilatation. A stone is present within the gallbladder and there is gallbladder wall thickening with surrounding fat stranding. Pancreas: Unremarkable. No pancreatic ductal dilatation or surrounding inflammatory changes. Spleen: Normal in size without focal abnormality. Adrenals/Urinary Tract: The adrenal glands are within normal limits. The kidneys enhance symmetrically. Nonobstructive left renal calculus is noted. No obstructive uropathy bilaterally. The bladder is unremarkable. Stomach/Bowel: There is a moderate  hiatal hernia. Stomach is within normal limits. Appendix appears normal. No evidence of bowel wall thickening, distention, or inflammatory  changes. No free air or pneumatosis. A few scattered diverticula are present along the colon without evidence of diverticulitis. A mild to moderate amount of retained stool is present in the colon. Vascular/Lymphatic: Aortic atherosclerosis. No enlarged abdominal or pelvic lymph nodes. Reproductive: Uterus and bilateral adnexa are unremarkable. Other: No abdominopelvic ascites. Musculoskeletal: Degenerative changes are present in the thoracolumbar spine. Mild compression deformity is noted in the superior endplate at L3, indeterminate in age. IMPRESSION: 1. Cholelithiasis with gallbladder wall thickening and surrounding fat stranding, concerning for acute cholecystitis. Ultrasound is recommended for further evaluation. 2. Hepatic steatosis. 3. Mild-to-moderate amount of retained stool in the colon. 4. Moderate hiatal hernia. 5. Diverticulosis without diverticulitis. 6. Aortic atherosclerosis. 7. Compression deformity in the superior endplate at L3, indeterminate in age. Electronically Signed   By: Thornell Sartorius M.D.   On: 02/08/2023 22:16   DG Abdomen 1 View  Result Date: 02/08/2023 CLINICAL DATA:  Constipation EXAM: ABDOMEN - 1 VIEW COMPARISON:  None Available. FINDINGS: The bowel gas pattern is normal. No radio-opaque calculi or other significant radiographic abnormality are seen. Mild stool in the colon. IMPRESSION: Negative.  Mild stool burden Electronically Signed   By: Jasmine Pang M.D.   On: 02/08/2023 21:07      Assessment/Plan 52 y/o F with calculous cholecystitis  - AFVSS, WBC 11.2 yesterday, LFTs WNL - recommend laparoscopic cholecystectomy today  The operative and non-operative management of cholecystitis was discussed with the patient. Risks of surgery including bleeding, infection, damage to surrounding structures, conversion to open, drain placement, need for additional procedures, prolonged hospital stay, as well as the risks of general anesthesia were discussed with the patient and she  would like to proceed with surgery. Questions were welcomed and answered.   FEN - NPO, IVF VTE - SCD's, Lovenox ID - Rocephin/Flagyl Admit - CCS service, observation  I reviewed nursing notes, ED provider notes, last 24 h vitals and pain scores, last 48 h intake and output, last 24 h labs and trends, and last 24 h imaging results.  Adam Phenix, [facility] 02/09/2023, 7:22 AM Please see Amion for pager number during day hours 7:00am-4:30pm or 7:00am -11:30am on weekends

## 2023-02-09 NOTE — Plan of Care (Signed)
?  Problem: Education: ?Goal: Knowledge of General Education information will improve ?Description: Including pain rating scale, medication(s)/side effects and non-pharmacologic comfort measures ?Outcome: Progressing ?  ?Problem: Health Behavior/Discharge Planning: ?Goal: Ability to manage health-related needs will improve ?Outcome: Progressing ?  ?Problem: Coping: ?Goal: Level of anxiety will decrease ?Outcome: Progressing ?  ?

## 2023-02-09 NOTE — Anesthesia Postprocedure Evaluation (Signed)
Anesthesia Post Note  Patient: Abigail Reese  Procedure(s) Performed: LAPAROSCOPIC CHOLECYSTECTOMY     Patient location during evaluation: PACU Anesthesia Type: General Level of consciousness: awake and alert Pain management: pain level controlled Vital Signs Assessment: post-procedure vital signs reviewed and stable Respiratory status: spontaneous breathing, nonlabored ventilation, respiratory function stable and patient connected to nasal cannula oxygen Cardiovascular status: blood pressure returned to baseline and stable Postop Assessment: no apparent nausea or vomiting Anesthetic complications: no   No notable events documented.  Last Vitals:  Vitals:   02/09/23 1138 02/09/23 1145  BP: 131/84 133/83  Pulse: 79 78  Resp: 13 13  Temp: 36.6 C   SpO2: 100% 100%    Last Pain:  Vitals:   02/09/23 1145  TempSrc:   PainSc: 0-No pain                 Collene Schlichter

## 2023-02-09 NOTE — Transfer of Care (Signed)
Immediate Anesthesia Transfer of Care Note  Patient: Abigail Reese  Procedure(s) Performed: LAPAROSCOPIC CHOLECYSTECTOMY  Patient Location: PACU  Anesthesia Type:General  Level of Consciousness: awake and drowsy  Airway & Oxygen Therapy: Patient Spontanous Breathing and Patient connected to face mask oxygen  Post-op Assessment: Report given to RN and Post -op Vital signs reviewed and stable  Post vital signs: Reviewed and stable  Last Vitals:  Vitals Value Taken Time  BP 131/84 02/09/23 1138  Temp    Pulse 77 02/09/23 1142  Resp 13 02/09/23 1142  SpO2 100 % 02/09/23 1142  Vitals shown include unvalidated device data.  Last Pain:  Vitals:   02/09/23 0928  TempSrc:   PainSc: 5       Patients Stated Pain Goal: 4 (02/09/23 1610)  Complications: No notable events documented.

## 2023-02-09 NOTE — Op Note (Signed)
LAPAROSCOPIC CHOLECYSTECTOMY  Procedure Note  Abigail Reese 02/09/2023   Pre-op Diagnosis: acute cholecystitis with cholelithiasis     Post-op Diagnosis: same  Procedure(s): LAPAROSCOPIC CHOLECYSTECTOMY  Surgeon(s): Abigail Miyamoto, MD  Anesthesia: General  Staff:  Scrub Person: Kem Parkinson, RN; Rema Jasmine  Estimated Blood Loss: Minimal               Specimens: sent to path  Findings: The patient was that I have acutely inflamed, distended gallbladder with gallstones  Procedure: The patient was brought to the operating identified as a correct patient.  She was placed upon the operating table and general anesthesia was induced.  Her abdomen was prepped and draped in the usual sterile fashion.  I made a vertical incision below the left with a scalpel.  I then carried this down to the fascia which was then open with a scalpel.  Hemostat was then used to gain entrance to the peritoneal cavity under direct vision.  A 0 Vicryl pursestring suture was placed around the fascial opening.  The [facility] port was placed through the opening and insufflation of the abdomen was begun.  I placed a 5 mm trocar in the patient's epigastrium and 2 more in the right upper quadrant all under direct vision.  The gallbladder was identified and found to be acutely inflamed and distended.  I did aspirate bile from the gallbladder in order to grasp it.  Adhesions were then taken down bluntly and with electrocautery.  Identified the cystic duct and clipped it 3 times proximally once distally after achieving a critical window around it.  I then transected with a laparoscopic scissors.  I next identified the cystic artery clipped proximally distally and transected as well.  The gallbladder was then slowly dissected free from the liver bed with the electrocautery.  Once it was freed from the liver bed was placed in an Endosac and removed through the incision at the umbilicus.  I then copiously irrigated the  abdomen with normal saline.  Hemostasis appeared to be achieved.  I then tied the 0 Vicryl the umbilicus in place closing the fascial defect.  All ports were removed under direct vision and the abdomen was deflated.  All incisions were anesthetized with Marcaine and closed with 4-0 Monocryl sutures.  Dermabond was then applied.  The patient tolerated procedure well.  All the counts were correct at the end of the procedure.  The patient was then extubated in the operating room and taken in a stable condition to the recovery room.          Abigail Miyamoto   Date: 02/09/2023  Time: 11:31 AM

## 2023-02-09 NOTE — Discharge Instructions (Signed)

## 2023-02-09 NOTE — Plan of Care (Signed)
°  Problem: Education: °Goal: Knowledge of General Education information will improve °Description: Including pain rating scale, medication(s)/side effects and non-pharmacologic comfort measures °Outcome: Progressing °  °Problem: Clinical Measurements: °Goal: Ability to maintain clinical measurements within normal limits will improve °Outcome: Progressing °  °Problem: Elimination: °Goal: Will not experience complications related to urinary retention °Outcome: Progressing °  °Problem: Pain Managment: °Goal: General experience of comfort will improve °Outcome: Progressing °  °Problem: Safety: °Goal: Ability to remain free from injury will improve °Outcome: Progressing °  °

## 2023-02-09 NOTE — Anesthesia Preprocedure Evaluation (Addendum)
Anesthesia Evaluation  Patient identified by MRN, date of birth, ID band Patient awake    Reviewed: Allergy & Precautions, NPO status , Patient's Chart, lab work & pertinent test results  Airway Mallampati: I  TM Distance: >3 FB Neck ROM: Full    Dental  (+) Dental Advisory Given, Edentulous Upper, Missing   Pulmonary Current Smoker and Patient abstained from smoking., former smoker   Pulmonary exam normal breath sounds clear to auscultation       Cardiovascular negative cardio ROS Normal cardiovascular exam Rhythm:Regular Rate:Normal     Neuro/Psych  PSYCHIATRIC DISORDERS  Depression    cerebral aneurysm s/p endovascular repair at [facility] in 2009, CVA with R sided weakness  CVA, Residual Symptoms    GI/Hepatic negative GI ROS,,,(+)     substance abuse  alcohol use and cocaine useCholecystitis   Endo/Other  negative endocrine ROS    Renal/GU negative Renal ROS     Musculoskeletal negative musculoskeletal ROS (+)    Abdominal   Peds  Hematology negative hematology ROS (+)   Anesthesia Other Findings Day of surgery medications reviewed with the patient.  Reproductive/Obstetrics                             Anesthesia Physical Anesthesia Plan  ASA: 3  Anesthesia Plan: General   Post-op Pain Management: Ofirmev IV (intra-op)* and Toradol IV (intra-op)*   Induction: Intravenous  PONV Risk Score and Plan: 2 and Midazolam, Dexamethasone and Ondansetron  Airway Management Planned: Oral ETT  Additional Equipment:   Intra-op Plan:   Post-operative Plan: Extubation in OR  Informed Consent: I have reviewed the patients History and Physical, chart, labs and discussed the procedure including the risks, benefits and alternatives for the proposed anesthesia with the patient or authorized representative who has indicated his/her understanding and acceptance.     Dental advisory  given  Plan Discussed with: CRNA  Anesthesia Plan Comments:         Anesthesia Quick Evaluation

## 2023-02-10 ENCOUNTER — Encounter (HOSPITAL_COMMUNITY): Payer: Self-pay | Admitting: Surgery

## 2023-02-10 NOTE — Discharge Summary (Signed)
Physician Discharge Summary  Patient ID: Abigail Reese MRN: [id number] DOB/AGE: 1970/11/13 52 y.o.  Admit date: 02/08/2023 Discharge date: 02/10/2023  Admission Diagnoses: Acute cholecystitis Discharge Diagnoses:  Principal Problem:   Acute cholecystitis   Discharged Condition: good  Hospital Course: Pt admitted with acute cholecystitis.  Lap cholecystectomy performed the following day.  She did well overnight.  By POD 1 she was tolerating a diet, pain was controlled with tylenol and she was ambulating without difficulty.   Consults: None  Significant Diagnostic Studies: labs: cmp, cbc and radiology: X-Ray: WNL, CT scan: Cholelithiasis with gallbladder wall thickening and surrounding, hiatal hernia, and Ultrasound: Cholelithiasis without evidence of acute cholecystitis  Treatments: IV hydration, antibiotics: cefotetan, analgesia: acetaminophen, and surgery: lap chole  Discharge Exam: Blood pressure 118/70, pulse 75, temperature 98.5 F (36.9 C), temperature source Oral, resp. rate 17, height 5\' 3"  (1.6 m), weight 66.1 kg, SpO2 98 %. General appearance: alert and cooperative GI: soft, non-distended, mild TTP around umb inc Incision/Wound: clean, dry, intact  Disposition: Discharge disposition: 01-Home or Self Care        Allergies as of 02/10/2023   No Known Allergies      Medication List     TAKE these medications    atorvastatin 10 MG tablet Commonly known as: LIPITOR Take 10 mg by mouth daily.   FISH OIL OMEGA-3 PO Take 1 capsule by mouth daily.   FLUoxetine HCl 60 MG Tabs Take 60 mg by mouth in the morning.   ondansetron 4 MG disintegrating tablet Commonly known as: ZOFRAN-ODT Take 4 mg by mouth every 8 (eight) hours as needed for nausea or vomiting (dissolve orally).   Ozempic (2 MG/DOSE) 8 MG/3ML Sopn Generic drug: Semaglutide (2 MG/DOSE) Inject 2 mg into the skin every Wednesday.   TURMERIC PO Take 1 capsule by mouth daily.   VITAMIN D3 PO Take  1 capsule by mouth daily.        Follow-up Information     [facility]zis, Hedda Slade, [place]. Go in 3 week(s).   Specialty: General Surgery Why: our office is scheduling you for post-operative follow up, please call to confirm appointment date/time. please bring photo ID. Contact information: [address] [place] [address] [phone]                 Signed: Vanita Panda 02/10/2023, 9:06 AM

## 2023-02-10 NOTE — Progress Notes (Signed)
Pt given d/c instructions and all questions answered. Taken by wheelchair to front entrance to meet ride.

## 2023-02-13 ENCOUNTER — Other Ambulatory Visit: Payer: Self-pay

## 2023-02-13 LAB — SURGICAL PATHOLOGY

## 2023-10-01 ENCOUNTER — Ambulatory Visit: Payer: 59 | Attending: Student | Admitting: Occupational Therapy

## 2023-10-01 DIAGNOSIS — R2681 Unsteadiness on feet: Secondary | ICD-10-CM | POA: Insufficient documentation

## 2023-10-01 DIAGNOSIS — R29818 Other symptoms and signs involving the nervous system: Secondary | ICD-10-CM | POA: Diagnosis present

## 2023-10-01 DIAGNOSIS — I69398 Other sequelae of cerebral infarction: Secondary | ICD-10-CM | POA: Diagnosis present

## 2023-10-01 DIAGNOSIS — R252 Cramp and spasm: Secondary | ICD-10-CM | POA: Diagnosis present

## 2023-10-01 NOTE — Patient Instructions (Signed)
  Your Splint This splint should initially be fitted by a healthcare practitioner.  The healthcare practitioner is responsible for providing wearing instructions and precautions to the patient, other healthcare practitioners and care provider involved in the patient's care.  This splint was custom made for you. Please read the following instructions to learn about wearing and caring for your splint.  Precautions Should your splint cause any of the following problems, remove the splint immediately and contact your therapist/physician. Swelling Severe Pain Pressure Areas Stiffness Numbness   When To Wear Your Splint Where your splint according to your therapist/physician instructions. Nights (however build up tolerance during the day next few days before switching to wearing at night)   Care and Cleaning of Your Splint Keep your splint away from open flames. Your splint will lose its shape in temperatures over 135 degrees Farenheit, ( in car windows, near radiators, ovens or in hot water).  Never make any adjustments to your splint, if the splint needs adjusting remove it and make an appointment to see your therapist. Your splint may be cleaned with rubbing alcohol.  Do not immerse in hot water over 135 degrees Farenheit. Straps may be washed with soap and water, but do not moisten the self-adhesive portion. For ink or hard to remove spots use a scouring cleanser which contains chlorine.  Rinse the splint thoroughly after using chlorine cleanser.

## 2023-10-01 NOTE — Therapy (Signed)
 OUTPATIENT OCCUPATIONAL THERAPY NEURO EVALUATION  Patient Name: Abigail Reese MRN: [id number] DOB:[birth year], 53 y.o., female Today's Date: 10/01/2023  PCP: Campbell Reynolds, NP REFERRING PROVIDER: Campbell Reynolds, NP  END OF SESSION:  OT End of Session - 10/01/23 1104     Visit Number 1    Number of Visits 5    Date for OT Re-Evaluation 11/01/23    Authorization Type [employer] Dual complete - covered 100%    OT Start Time 0930    OT Stop Time 1045    OT Time Calculation (min) 75 min    Activity Tolerance Patient tolerated treatment well    Behavior During Therapy WFL for tasks assessed/performed             Past Medical History:  Diagnosis Date   Brain aneurysm    Depression    Paralysis (HCC)    R sided deficits    Stroke [facility])    Past Surgical History:  Procedure Laterality Date   CEREBRAL ANEURYSM REPAIR     CESAREAN SECTION     CHOLECYSTECTOMY N/A 02/09/2023   Procedure: LAPAROSCOPIC CHOLECYSTECTOMY;  Surgeon: Vernetta Berg, MD;  Location: [facility];  Service: General;  Laterality: N/A;   Patient Active Problem List   Diagnosis Date Noted   Acute cholecystitis 02/09/2023    ONSET DATE: 08/29/2023  REFERRING DIAG: S13.26 (ICD-10-CM) - Personal history of transient ischemic attack (TIA), and cerebral infarction without residual deficits  THERAPY DIAG:  Spasticity as late effect of cerebrovascular accident (CVA)  Other symptoms and signs involving the nervous system  Unsteadiness on feet  Rationale for Evaluation and Treatment: Rehabilitation  SUBJECTIVE:   SUBJECTIVE STATEMENT: I do everything pretty much myself Pt accompanied by: self  PERTINENT HISTORY: CVA 12/07/2013,  Residual Rt side spastic hemiplegia, depression, brain aneurysm  PRECAUTIONS: Other: Stent, no driving  WEIGHT BEARING RESTRICTIONS: No  PAIN:  Are you having pain? No (Rt hip occasionally hurts if standing or walking too long)  FALLS: Has patient fallen in last 6 months? No  LIVING  ENVIRONMENT: Lives with: lives alone and but has son close by and checks in frequently Lives in: apartment -2nd floor (16 steps to enter, railing on both sides) Has following equipment at home: Quad cane small base, shower chair, and Grab bars  PLOF: Independent  PATIENT GOALS: Get a splint and botox  OBJECTIVE:  Note: Objective measures were completed at Evaluation unless otherwise noted.  HAND DOMINANCE: Left  ADLs: Overall ADLs: mod I for all ADLS Transfers/ambulation related to ADLs: MOD I   UB Dressing: sports bra LB Dressing: keeps shoes tied, elastic pants Equipment: Shower seat with back and Grab bars  IADLs: Shopping: mod I  Light housekeeping: mod I  Meal Prep: mod I - but not safe w/o AE Community mobility: does not drive Medication management: independent Financial management: independent  Handwriting:  denies change  MOBILITY STATUS:  walks w/o device at times but safer with DME . Pt arrived w/o cane today but would be safer with cane   UPPER EXTREMITY ROM:  RUE dominated by synergy pattern with minimal movement in sh hiking and elbow flex. Difficulty moving in PROM d/t spasticity  LUE AROM WNLs   UPPER EXTREMITY MMT:   Not tested d/t spasticity  HAND FUNCTION: Unable Rt hand  COORDINATION: Unable Rt hand  SENSATION: Not tested  EDEMA: none  MUSCLE TONE: RUE: Severe, Hypertonic, and Modifed Ashworth Scale 3 = Considerable increase in muscle tone, passive movement difficult  COGNITION:  Overall cognitive status: Within functional limits for tasks assessed but overall slightly impaired  VISION: Not tested   VISION ASSESSMENT: To be further assessed in functional context prn   OBSERVATIONS: Pt w/ chronic spasticity RUE - would benefit from botox                                                                                                                             TREATMENT DATE: 10/01/23  Fabricated and fitted resting hand splint and  issued. In order to achieve some opening of hand and thumb, wrist had to be placed in flexion d/t spasticity. Also adapted wrist strap for maintain contact with splint. Reviewed wear and care with patient.   Due to spasticity - pt also provided w/ handout on prefab splint that may work better and may be more comfortable.   Did recommend pt discuss w/ PCP getting referral to neurologist for botox assessment.    PATIENT EDUCATION: Education details: splint wear and care Person educated: Patient Education method: Explanation, Demonstration, Verbal cues, and Handouts Education comprehension: verbalized understanding, verbal cues required, tactile cues required, and needs further education  HOME EXERCISE PROGRAM: 10/01/23: splint wear and care   GOALS: Goals reviewed with patient? Yes   LONG TERM GOALS: Target date: 11/01/23  Independent with splint wear and care Baseline:  Goal status: IN PROGRESS  2.  Pt to verbalize understanding with A/E to increase safety and independence with ADLS (Rocker knife, one handed cutting board, pot stabilizer, oven rack pull, shoe buttons)  Baseline:  Goal status: INITIAL  3.  Independent with HEP for stretching RUE Baseline:  Goal status: INITIAL  4.  Pt to verbalize understanding of adaptations for handle of cane prn Baseline:  Goal status: INITIAL    ASSESSMENT:  CLINICAL IMPRESSION: Patient is a 53 y.o. female who was seen today for occupational therapy evaluation for splinting RUE d/t spasticity from stroke 10 years ago. Patient currently presents below baseline level of functioning demonstrating functional deficits and impairments as noted below. Pt would benefit from skilled OT services in the outpatient setting to work on impairments as noted below to help pt return to PLOF as able.   Abigail Reese   PERFORMANCE DEFICITS: in functional skills including ADLs, IADLs, sensation, tone, ROM, pain, mobility, balance, decreased knowledge of use of DME,  and UE functional use, cognitive skills including safety awareness, and psychosocial skills including coping strategies.   IMPAIRMENTS: are limiting patient from ADLs, IADLs, and social participation.   CO-MORBIDITIES: may have co-morbidities  that affects occupational performance. Patient will benefit from skilled OT to address above impairments and improve overall function.  MODIFICATION OR ASSISTANCE TO COMPLETE EVALUATION: No modification of tasks or assist necessary to complete an evaluation.  OT OCCUPATIONAL PROFILE AND HISTORY: Problem focused assessment: Including review of records relating to presenting problem.  CLINICAL DECISION MAKING: LOW - limited treatment options, no task modification necessary  REHAB POTENTIAL: Good  EVALUATION COMPLEXITY: Low  PLAN:  OT FREQUENCY: 1x/week  OT DURATION: 4 weeks - plus evaluation  PLANNED INTERVENTIONS: 97535 self care/ADL training, 02889 therapeutic exercise, 97530 therapeutic activity, 97112 neuromuscular re-education, 97140 manual therapy, 97010 moist heat, 97760 Orthotics management and training, 02239 Splinting (initial encounter), [phone] Subsequent splinting/medication, passive range of motion, coping strategies training, patient/family education, and DME and/or AE instructions  RECOMMENDED OTHER SERVICES: Botox for RUE - discussed w/ patient at evaluation getting her PCP to make referral to neurologist for botox assessment  CONSULTED AND AGREED WITH PLAN OF CARE: Patient  PLAN FOR NEXT SESSION: Splint adjustments prn, adaptations to cane if pt brings in, show A/E recommendations   Abigail Reese, OT 10/01/2023, 11:08 AM

## 2023-10-08 ENCOUNTER — Encounter: Payer: Self-pay | Admitting: Occupational Therapy

## 2023-10-08 ENCOUNTER — Ambulatory Visit: Payer: 59 | Admitting: Occupational Therapy

## 2023-10-08 DIAGNOSIS — R2681 Unsteadiness on feet: Secondary | ICD-10-CM

## 2023-10-08 DIAGNOSIS — R252 Cramp and spasm: Secondary | ICD-10-CM

## 2023-10-08 DIAGNOSIS — I69398 Other sequelae of cerebral infarction: Secondary | ICD-10-CM | POA: Diagnosis not present

## 2023-10-08 DIAGNOSIS — R29818 Other symptoms and signs involving the nervous system: Secondary | ICD-10-CM

## 2023-10-08 NOTE — Therapy (Signed)
OUTPATIENT OCCUPATIONAL THERAPY NEURO TREATMENT  Patient Name: Abigail Reese MRN: [id number] DOB:30-Sep-1970, 53 y.o., female Today's Date: 10/08/2023  PCP: Hillery Aldo, NP REFERRING PROVIDER: Hillery Aldo, NP  END OF SESSION:  OT End of Session - 10/08/23 1232     Visit Number 2    Number of Visits 5    Date for OT Re-Evaluation 11/01/23    Authorization Type [employer] Dual complete - covered 100%    OT Start Time 1230    OT Stop Time 1315    OT Time Calculation (min) 45 min    Activity Tolerance Patient tolerated treatment well    Behavior During Therapy WFL for tasks assessed/performed             Past Medical History:  Diagnosis Date   Brain aneurysm    Depression    Paralysis (HCC)    R sided deficits    Stroke [facility])    Past Surgical History:  Procedure Laterality Date   CEREBRAL ANEURYSM REPAIR     CESAREAN SECTION     CHOLECYSTECTOMY N/A 02/09/2023   Procedure: LAPAROSCOPIC CHOLECYSTECTOMY;  Surgeon: Abigail Miyamoto, MD;  Location: [facility];  Service: General;  Laterality: N/A;   Patient Active Problem List   Diagnosis Date Noted   Acute cholecystitis 02/09/2023    ONSET DATE: 08/29/2023  REFERRING DIAG: W10.27 (ICD-10-CM) - Personal history of transient ischemic attack (TIA), and cerebral infarction without residual deficits  THERAPY DIAG:  Spasticity as late effect of cerebrovascular accident (CVA)  Other symptoms and signs involving the nervous system  Unsteadiness on feet  Rationale for Evaluation and Treatment: Rehabilitation  SUBJECTIVE:   SUBJECTIVE STATEMENT: I do everything pretty much myself Pt accompanied by: self  PERTINENT HISTORY: CVA 12/07/2013,  Residual Rt side spastic hemiplegia, depression, brain aneurysm  PRECAUTIONS: Other: Stent, no driving  WEIGHT BEARING RESTRICTIONS: No  PAIN:  Are you having pain? No (Rt hip occasionally hurts if standing or walking too long)  FALLS: Has patient fallen in last 6 months? No  LIVING  ENVIRONMENT: Lives with: lives alone and but has son close by and checks in frequently Lives in: apartment -2nd floor (16 steps to enter, railing on both sides) Has following equipment at home: Quad cane small base, shower chair, and Grab bars  PLOF: Independent  PATIENT GOALS: Get a splint and botox  OBJECTIVE:  Note: Objective measures were completed at Evaluation unless otherwise noted.  HAND DOMINANCE: Left  ADLs: Overall ADLs: mod I for all ADLS Transfers/ambulation related to ADLs: MOD I   UB Dressing: sports bra LB Dressing: keeps shoes tied, elastic pants Equipment: Shower seat with back and Grab bars  IADLs: Shopping: mod I  Light housekeeping: mod I  Meal Prep: mod I - but not safe w/o AE Community mobility: does not drive Medication management: independent Financial management: independent  Handwriting:  denies change  MOBILITY STATUS:  walks w/o device at times but safer with DME . Pt arrived w/o cane today but would be safer with cane   UPPER EXTREMITY ROM:  RUE dominated by synergy pattern with minimal movement in sh hiking and elbow flex. Difficulty moving in PROM d/t spasticity  LUE AROM WNLs   UPPER EXTREMITY MMT:   Not tested d/t spasticity  HAND FUNCTION: Unable Rt hand  COORDINATION: Unable Rt hand  SENSATION: Not tested  EDEMA: none  MUSCLE TONE: RUE: Severe, Hypertonic, and Modifed Ashworth Scale 3 = Considerable increase in muscle tone, passive movement difficult  COGNITION:  Overall cognitive status: Within functional limits for tasks assessed but overall slightly impaired  VISION: Not tested   VISION ASSESSMENT: To be further assessed in functional context prn   OBSERVATIONS: Pt w/ chronic spasticity RUE - would benefit from botox                                                                                                                             TREATMENT DATE: 10/08/23  Made slight adjustments to resting hand splint  for greater comfort and fit including flaring away splint at radial side of wrist, and adjusting finger strapping for better fit Reviewed wear and care with patient.   Pt did not bring in cane for handle adaptations today. Pt instructed to bring next session.   Pt shown A/E to increase ease and safety with ADLS including: rocker knife, shoe buttons, one handed cutting board, and pot stabilizer.  Pt provided handouts and shown how/where to purchase if interested/able.   Pt practiced use of rocker knife and therapist demo use of shoe buttons   PATIENT EDUCATION: Education details: splint wear and care review, A/E recommendations Person educated: Patient Education method: Explanation, Demonstration, Verbal cues, and Handouts Education comprehension: verbalized understanding, verbal cues required, tactile cues required, and needs further education  HOME EXERCISE PROGRAM: 10/01/23: splint wear and care   GOALS: Goals reviewed with patient? Yes   LONG TERM GOALS: Target date: 11/01/23  Independent with splint wear and care Baseline:  Goal status: MET  2.  Pt to verbalize understanding with A/E to increase safety and independence with ADLS (Rocker knife, one handed cutting board, pot stabilizer, oven rack pull, shoe buttons)  Baseline:  Goal status: MET  3.  Independent with HEP for stretching RUE Baseline:  Goal status: INITIAL  4.  Pt to verbalize understanding of adaptations for handle of cane prn Baseline:  Goal status: INITIAL    ASSESSMENT:  CLINICAL IMPRESSION: Patient seen today for occupational therapy treatment. Pt has met 2 LTG's at this time.  Pt would continue to benefit from skilled OT services in the outpatient setting to work on remaining goals.  Marland Kitchen   PERFORMANCE DEFICITS: in functional skills including ADLs, IADLs, sensation, tone, ROM, pain, mobility, balance, decreased knowledge of use of DME, and UE functional use, cognitive skills including safety  awareness, and psychosocial skills including coping strategies.   IMPAIRMENTS: are limiting patient from ADLs, IADLs, and social participation.   CO-MORBIDITIES: may have co-morbidities  that affects occupational performance. Patient will benefit from skilled OT to address above impairments and improve overall function.  MODIFICATION OR ASSISTANCE TO COMPLETE EVALUATION: No modification of tasks or assist necessary to complete an evaluation.  OT OCCUPATIONAL PROFILE AND HISTORY: Problem focused assessment: Including review of records relating to presenting problem.  CLINICAL DECISION MAKING: LOW - limited treatment options, no task modification necessary  REHAB POTENTIAL: Good  EVALUATION COMPLEXITY: Low    PLAN:  OT FREQUENCY: 1x/week  OT  DURATION: 4 weeks - plus evaluation  PLANNED INTERVENTIONS: 97535 self care/ADL training, 30865 therapeutic exercise, 97530 therapeutic activity, 97112 neuromuscular re-education, 97140 manual therapy, 97010 moist heat, 97760 Orthotics management and training, 78469 Splinting (initial encounter), [phone] Subsequent splinting/medication, passive range of motion, coping strategies training, patient/family education, and DME and/or AE instructions  RECOMMENDED OTHER SERVICES: Botox for RUE - discussed w/ patient at evaluation getting her PCP to make referral to neurologist for botox assessment  CONSULTED AND AGREED WITH PLAN OF CARE: Patient  PLAN FOR NEXT SESSION:  adaptations to cane if pt brings in, HEP   [place], OT 10/08/2023, 12:32 PM

## 2023-10-15 ENCOUNTER — Ambulatory Visit: Payer: 59 | Admitting: Occupational Therapy

## 2023-10-22 ENCOUNTER — Ambulatory Visit: Payer: 59 | Attending: Student | Admitting: Occupational Therapy

## 2023-10-22 ENCOUNTER — Encounter: Payer: Self-pay | Admitting: Occupational Therapy

## 2023-10-22 DIAGNOSIS — R2681 Unsteadiness on feet: Secondary | ICD-10-CM | POA: Insufficient documentation

## 2023-10-22 DIAGNOSIS — R29818 Other symptoms and signs involving the nervous system: Secondary | ICD-10-CM | POA: Insufficient documentation

## 2023-10-22 NOTE — Therapy (Signed)
OUTPATIENT OCCUPATIONAL THERAPY NEURO TREATMENT/DISCHARGE  Patient Name: Abigail Reese MRN: [id number] DOB:1970-12-10, 53 y.o., female Today's Date: 10/22/2023  PCP: Hillery Aldo, NP REFERRING PROVIDER: Hillery Aldo, NP  OCCUPATIONAL THERAPY DISCHARGE SUMMARY  Visits from Start of Care: 3  Current functional level related to goals / functional outcomes: SEE BELOW   Remaining deficits: RUE spastic hemiplegia   Education / Equipment: HEP, splint wear and care, A/E recommendations   Patient agrees to discharge. Patient goals were met. Patient is being discharged due to meeting the stated rehab goals..     END OF SESSION:  OT End of Session - 10/22/23 1231     Visit Number 3    Number of Visits 5    Date for OT Re-Evaluation 11/01/23    Authorization Type [employer] Dual complete - covered 100%    OT Start Time 1226    OT Stop Time 1305    OT Time Calculation (min) 39 min    Activity Tolerance Patient tolerated treatment well    Behavior During Therapy WFL for tasks assessed/performed             Past Medical History:  Diagnosis Date   Brain aneurysm    Depression    Paralysis (HCC)    R sided deficits    Stroke [facility])    Past Surgical History:  Procedure Laterality Date   CEREBRAL ANEURYSM REPAIR     CESAREAN SECTION     CHOLECYSTECTOMY N/A 02/09/2023   Procedure: LAPAROSCOPIC CHOLECYSTECTOMY;  Surgeon: Abigail Miyamoto, MD;  Location: [facility];  Service: General;  Laterality: N/A;   Patient Active Problem List   Diagnosis Date Noted   Acute cholecystitis 02/09/2023    ONSET DATE: 08/29/2023  REFERRING DIAG: U72.53 (ICD-10-CM) - Personal history of transient ischemic attack (TIA), and cerebral infarction without residual deficits  THERAPY DIAG:  Other symptoms and signs involving the nervous system  Unsteadiness on feet  Rationale for Evaluation and Treatment: Rehabilitation  SUBJECTIVE:   SUBJECTIVE STATEMENT: I do everything pretty much myself Pt  accompanied by: self  PERTINENT HISTORY: CVA 12/07/2013,  Residual Rt side spastic hemiplegia, depression, brain aneurysm  PRECAUTIONS: Other: Stent, no driving  WEIGHT BEARING RESTRICTIONS: No  PAIN:  Are you having pain? No (Rt hip occasionally hurts if standing or walking too long)  FALLS: Has patient fallen in last 6 months? No  LIVING ENVIRONMENT: Lives with: lives alone and but has son close by and checks in frequently Lives in: apartment -2nd floor (16 steps to enter, railing on both sides) Has following equipment at home: Quad cane small base, shower chair, and Grab bars  PLOF: Independent  PATIENT GOALS: Get a splint and botox  OBJECTIVE:  Note: Objective measures were completed at Evaluation unless otherwise noted.  HAND DOMINANCE: Left  ADLs: Overall ADLs: mod I for all ADLS Transfers/ambulation related to ADLs: MOD I   UB Dressing: sports bra LB Dressing: keeps shoes tied, elastic pants Equipment: Shower seat with back and Grab bars  IADLs: Shopping: mod I  Light housekeeping: mod I  Meal Prep: mod I - but not safe w/o AE Community mobility: does not drive Medication management: independent Financial management: independent  Handwriting:  denies change  MOBILITY STATUS:  walks w/o device at times but safer with DME . Pt arrived w/o cane today but would be safer with cane   UPPER EXTREMITY ROM:  RUE dominated by synergy pattern with minimal movement in sh hiking and elbow flex. Difficulty moving in  PROM d/t spasticity  LUE AROM WNLs   UPPER EXTREMITY MMT:   Not tested d/t spasticity  HAND FUNCTION: Unable Rt hand  COORDINATION: Unable Rt hand  SENSATION: Not tested  EDEMA: none  MUSCLE TONE: RUE: Severe, Hypertonic, and Modifed Ashworth Scale 3 = Considerable increase in muscle tone, passive movement difficult  COGNITION: Overall cognitive status: Within functional limits for tasks assessed but overall slightly impaired  VISION: Not  tested   VISION ASSESSMENT: To be further assessed in functional context prn   OBSERVATIONS: Pt w/ chronic spasticity RUE - would benefit from botox                                                                                                                             TREATMENT DATE: 10/22/23  Reviewed proper placement of padding on wrist and finger straps.   Pt given strapping for cane to wrap when collapsible. Pt shown how to use  Pt issued HEP for self PROM to RUE to maintain ROM and reduce spasticity as able - see pt instructions for details. Pt demo each as indicated     PATIENT EDUCATION: Education details: self PROM for RUE Person educated: Patient Education method: Programmer, multimedia, Demonstration, Verbal cues, and Handouts Education comprehension: verbalized understanding and returned demonstration  HOME EXERCISE PROGRAM: 10/01/23: splint wear and care 10/22/23: self PROM HEP   GOALS: Goals reviewed with patient? Yes   LONG TERM GOALS: Target date: 11/01/23  Independent with splint wear and care Baseline:  Goal status: MET  2.  Pt to verbalize understanding with A/E to increase safety and independence with ADLS (Rocker knife, one handed cutting board, pot stabilizer, oven rack pull, shoe buttons)  Baseline:  Goal status: MET  3.  Independent with HEP for stretching RUE Baseline:  Goal status: MET  4.  Pt to verbalize understanding of adaptations for handle of cane prn Baseline:  Goal status: MET     ASSESSMENT:  CLINICAL IMPRESSION: Patient has met all LTG's at this time. Pt would benefit from further O.T. once she receives botox injections to RUE in May .   PERFORMANCE DEFICITS: in functional skills including ADLs, IADLs, sensation, tone, ROM, pain, mobility, balance, decreased knowledge of use of DME, and UE functional use, cognitive skills including safety awareness, and psychosocial skills including coping strategies.   IMPAIRMENTS: are limiting  patient from ADLs, IADLs, and social participation.   CO-MORBIDITIES: may have co-morbidities  that affects occupational performance. Patient will benefit from skilled OT to address above impairments and improve overall function.  MODIFICATION OR ASSISTANCE TO COMPLETE EVALUATION: No modification of tasks or assist necessary to complete an evaluation.  OT OCCUPATIONAL PROFILE AND HISTORY: Problem focused assessment: Including review of records relating to presenting problem.  CLINICAL DECISION MAKING: LOW - limited treatment options, no task modification necessary  REHAB POTENTIAL: Good  EVALUATION COMPLEXITY: Low    PLAN:  OT FREQUENCY: 1x/week  OT DURATION: 4 weeks - plus  evaluation  PLANNED INTERVENTIONS: 97535 self care/ADL training, 16109 therapeutic exercise, 97530 therapeutic activity, 97112 neuromuscular re-education, 97140 manual therapy, 97010 moist heat, 97760 Orthotics management and training, 60454 Splinting (initial encounter), [phone] Subsequent splinting/medication, passive range of motion, coping strategies training, patient/family education, and DME and/or AE instructions  RECOMMENDED OTHER SERVICES: Botox for RUE - discussed w/ patient at evaluation getting her PCP to make referral to neurologist for botox assessment  CONSULTED AND AGREED WITH PLAN OF CARE: Patient  PLAN:  D/C O.T.   Sheran Lawless, OT 10/22/2023, 1:06 PM

## 2023-10-22 NOTE — Patient Instructions (Signed)
Flexion (Passive)    Sitting upright, slide forearm forward along table, bending from the waist until a stretch is felt. Hold __5__ seconds. Repeat __10__ times.  Then repeat side to side x 10 reps. Use other hand to guide Rt arm.  Do __2__ sessions per day.   Flexion (Assistive)    Hold Rt wrist with Lt hand (thumb side up) and raise arms above head, keeping elbows as straight as possible. Can be done sitting or lying. Repeat _10___ times. Do __2__ sessions per day.   Supination (Passive)    Keep elbow bent at right angle and held firmly at side. Use other hand to turn forearm until palm faces upward. Hold __10__ seconds. Repeat _5___ times. Do __2__ sessions per day.   Extension (Passive)    Using other hand, lift hand at wrist as far as possible. Hold __10__ seconds. Repeat __5__ times. Do _2___ sessions per day.

## 2023-10-29 ENCOUNTER — Encounter: Payer: 59 | Admitting: Occupational Therapy

## 2023-11-02 ENCOUNTER — Other Ambulatory Visit: Payer: Self-pay | Admitting: Student

## 2023-11-02 DIAGNOSIS — F17210 Nicotine dependence, cigarettes, uncomplicated: Secondary | ICD-10-CM

## 2024-01-16 DIAGNOSIS — I679 Cerebrovascular disease, unspecified: Secondary | ICD-10-CM | POA: Insufficient documentation

## 2024-01-16 DIAGNOSIS — F329 Major depressive disorder, single episode, unspecified: Secondary | ICD-10-CM | POA: Insufficient documentation

## 2024-01-17 ENCOUNTER — Telehealth: Payer: Self-pay | Admitting: Neurology

## 2024-01-17 NOTE — Telephone Encounter (Signed)
 MYC conf

## 2024-01-21 ENCOUNTER — Ambulatory Visit: Payer: 59 | Admitting: Neurology

## 2024-01-21 ENCOUNTER — Telehealth: Payer: Self-pay | Admitting: Neurology

## 2024-01-21 NOTE — Telephone Encounter (Signed)
 Patient rescheduled appt due to feeling nauseous

## 2024-05-22 ENCOUNTER — Telehealth: Payer: Self-pay | Admitting: Neurology

## 2024-05-22 NOTE — Telephone Encounter (Signed)
 MYC conf

## 2024-05-26 ENCOUNTER — Encounter: Payer: Self-pay | Admitting: Neurology

## 2024-05-26 ENCOUNTER — Ambulatory Visit: Admitting: Neurology
